# Patient Record
Sex: Male | Born: 1990 | Race: Black or African American | Hispanic: No | Marital: Single | Smoking: Current some day smoker
Health system: Southern US, Community
[De-identification: ages and names within clinical notes are randomized; demographics above are authoritative.]

## PROBLEM LIST (undated history)

## (undated) DIAGNOSIS — J45909 Unspecified asthma, uncomplicated: Secondary | ICD-10-CM

## (undated) HISTORY — PX: TONSILLECTOMY: SUR1361

---
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
## 2010-11-29 ENCOUNTER — Emergency Department: Payer: Self-pay | Admitting: Emergency Medicine

## 2012-03-25 ENCOUNTER — Emergency Department: Payer: Self-pay | Admitting: Emergency Medicine

## 2012-03-25 LAB — COMPREHENSIVE METABOLIC PANEL
Albumin: 3.9 g/dL (ref 3.4–5.0)
Alkaline Phosphatase: 83 U/L (ref 50–136)
Anion Gap: 5 — ABNORMAL LOW (ref 7–16)
BUN: 11 mg/dL (ref 7–18)
Co2: 27 mmol/L (ref 21–32)
Creatinine: 1.13 mg/dL (ref 0.60–1.30)
EGFR (Non-African Amer.): >60
Glucose: 106 mg/dL — ABNORMAL HIGH (ref 65–99)
SGOT(AST): 34 U/L (ref 15–37)
SGPT (ALT): 29 U/L (ref 12–78)
Sodium: 138 mmol/L (ref 136–145)
Total Protein: 8.3 g/dL — ABNORMAL HIGH (ref 6.4–8.2)

## 2012-03-25 LAB — CBC
HCT: 49.1 % (ref 40.0–52.0)
HGB: 16.2 g/dL (ref 13.0–18.0)
MCH: 30.6 pg (ref 26.0–34.0)
Platelet: 262 10*3/uL (ref 150–440)
WBC: 7.8 10*3/uL (ref 3.8–10.6)

## 2012-03-25 LAB — URINALYSIS, COMPLETE
Bacteria: NONE SEEN
Blood: NEGATIVE
Ketone: NEGATIVE
Nitrite: NEGATIVE
Ph: 6 (ref 4.5–8.0)
Protein: NEGATIVE
RBC,UR: 3 /HPF (ref 0–5)
Squamous Epithelial: <1
WBC UR: <1 /HPF (ref 0–5)

## 2015-07-29 ENCOUNTER — Encounter: Payer: Self-pay | Admitting: *Deleted

## 2015-07-29 ENCOUNTER — Ambulatory Visit
Admission: EM | Admit: 2015-07-29 | Discharge: 2015-07-29 | Disposition: A | Discharge status: Home / Self Care | Payer: 59 | Attending: Family Medicine | Admitting: Family Medicine

## 2015-07-29 DIAGNOSIS — H1131 Conjunctival hemorrhage, right eye: Secondary | ICD-10-CM

## 2015-07-29 MED ORDER — KETOTIFEN FUMARATE 0.025 % OP SOLN: 1.0000 [drp] | Freq: Two times a day (BID) | OPHTHALMIC | Status: DC

## 2015-07-29 NOTE — Discharge Instructions (Signed)
Subconjunctival Hemorrhage °Subconjunctival hemorrhage is bleeding that happens between the white part of your eye (sclera) and the clear membrane that covers the outside of your eye (conjunctiva). There are many tiny blood vessels near the surface of your eye. A subconjunctival hemorrhage happens when one or more of these vessels breaks and bleeds, causing a red patch to appear on your eye. This is similar to a bruise. °Depending on the amount of bleeding, the red patch may only cover a small area of your eye or it may cover the entire visible part of the sclera. If a lot of blood collects under the conjunctiva, there may also be swelling. Subconjunctival hemorrhages do not affect your vision or cause pain, but your eye may feel irritated if there is swelling. Subconjunctival hemorrhages usually do not require treatment, and they disappear on their own within two weeks. °CAUSES °This condition may be caused by: °· Mild trauma, such as rubbing your eye too hard. °· Severe trauma or blunt injuries. °· Coughing, sneezing, or vomiting. °· Straining, such as when lifting a heavy object. °· High blood pressure. °· Recent eye surgery. °· A history of diabetes. °· Certain medicines, especially blood thinners (anticoagulants). °· Other conditions, such as eye tumors, bleeding disorders, or blood vessel abnormalities. °Subconjunctival hemorrhages can happen without an obvious cause.  °SYMPTOMS  °Symptoms of this condition include: °· A bright red or dark red patch on the white part of the eye. °¨ The red area may spread out to cover a larger area of the eye before it goes away. °¨ The red area may turn brownish-yellow before it goes away. °· Swelling. °· Mild eye irritation. °DIAGNOSIS °This condition is diagnosed with a physical exam. If your subconjunctival hemorrhage was caused by trauma, your health care provider may refer you to an eye specialist (ophthalmologist) or another specialist to check for other injuries. You  may have other tests, including: °· An eye exam. °· A blood pressure check. °· Blood tests to check for bleeding disorders. °If your subconjunctival hemorrhage was caused by trauma, X-rays or a CT scan may be done to check for other injuries. °TREATMENT °Usually, no treatment is needed. Your health care provider may recommend eye drops or cold compresses to help with discomfort. °HOME CARE INSTRUCTIONS °· Take over-the-counter and prescription medicines only as directed by your health care provider. °· Use eye drops or cold compresses to help with discomfort as directed by your health care provider. °· Avoid activities, things, and environments that may irritate or injure your eye. °· Keep all follow-up visits as told by your health care provider. This is important. °SEEK MEDICAL CARE IF: °· You have pain in your eye. °· The bleeding does not go away within 3 weeks. °· You keep getting new subconjunctival hemorrhages. °SEEK IMMEDIATE MEDICAL CARE IF: °· Your vision changes or you have difficulty seeing. °· You suddenly develop severe sensitivity to light. °· You develop a severe headache, persistent vomiting, confusion, or abnormal tiredness (lethargy). °· Your eye seems to bulge or protrude from your eye socket. °· You develop unexplained bruises on your body. °· You have unexplained bleeding in another area of your body. °  °This information is not intended to replace advice given to you by your health care provider. Make sure you discuss any questions you have with your health care provider. °  °Document Released: 01/08/2005 Document Revised: 09/29/2014 Document Reviewed: 03/17/2014 °Elsevier Interactive Patient Education ©2016 [employer] ° °

## 2015-07-29 NOTE — ED Notes (Signed)
Patient started having symptoms of right eye redness, itchiness, and pain 5 days ago. Patient tried to use OTC eye drops without resolution.

## 2015-07-29 NOTE — ED Provider Notes (Signed)
CSN: [id number]     Arrival date & time 07/29/15  1044 History   First MD Initiated Contact with Patient 07/29/15 1155     Chief Complaint  Patient presents with  . Conjunctivitis   (Consider location/radiation/quality/duration/timing/severity/associated sxs/prior Treatment) HPI   This 25 year old male who began to have symptoms of right eye redness and itchiness and pain over the lateral portion approximate 5 days ago. He does not remember any trauma. He has had no sensation of foreign object. He is only mildly light sensitive. He does not wear contact lenses. He has no history of high blood pressure although his pressure today is 141/94. He has no history of sickle cell trait or disease. He does work in [employer] and does lift heavy objects at times.      History reviewed. No pertinent past medical history. Past Surgical History  Procedure Laterality Date  . Tonsillectomy     Family History  Problem Relation Age of Onset  . Hypertension Mother   . Hypertension Father    Social History  Substance Use Topics  . Smoking status: Never Smoker   . Smokeless tobacco: Never Used  . Alcohol Use: Yes    Review of Systems  Constitutional: Negative for fever, chills, activity change, appetite change and fatigue.  Eyes: Positive for photophobia, pain, discharge, redness and itching. Negative for visual disturbance.  All other systems reviewed and are negative.   Allergies  Review of patient's allergies indicates no known allergies.  Home Medications   Prior to Admission medications   Medication Sig Start Date End Date Taking? Authorizing Provider  ketotifen (ZADITOR) 0.025 % ophthalmic solution Place 1 drop into the right eye 2 (two) times daily. 07/29/15   Lutricia FeilWilliam P Sebasthian Stailey, PA-C   Meds Ordered and Administered this Visit  Medications - No data to display  BP 141/94 mmHg  Pulse 76  Temp(Src) 97.6 F (36.4 C) (Oral)  Resp 16  Ht 5\' 10"  (1.778 m)  Wt 380 lb (172.367 kg)   BMI 54.52 kg/m2  SpO2 98% No data found.   Physical Exam  Constitutional: He is oriented to person, place, and time. He appears well-developed and well-nourished. No distress.  HENT:  Head: Normocephalic and atraumatic.  Eyes: EOM are normal. Pupils are equal, round, and reactive to light. Right eye exhibits no discharge. Left eye exhibits no discharge.  Semination of the right eye shows a large area of subconjunctival bleeding over the lateral aspect. It does not involve the medial portion of the eye. Pupils are equal and reactive to light and accommodation. EOMs are intact. There is no deformity of the pupil and no hyphema visualized. Patient appears to feel comfortable was not squinting. Vision is normal as marked in the record.  Neck: Neck supple.  Musculoskeletal: Normal range of motion.  Lymphadenopathy:    He has no cervical adenopathy.  Neurological: He is alert and oriented to person, place, and time.  Skin: Skin is warm and dry. He is not diaphoretic.  Psychiatric: He has a normal mood and affect. His behavior is normal. Judgment and thought content normal.  Nursing note and vitals reviewed.   ED Course  Procedures (including critical care time)  Labs Review Labs Reviewed - No data to display  Imaging Review No results found.   Visual Acuity Review  Right Eye Distance: 20/20 Left Eye Distance: 20/15 Bilateral Distance:    Right Eye Near:   Left Eye Near:    Bilateral Near:  MDM   1. Subconjunctival hemorrhage of right eye    I reassured the patient that this a conjunctival hemorrhage usually resolves within 2-3 weeks. If he has any worsening of his symptoms have given them the phone number for [facility]. He should probably follow-up with his primary care physician to see if there may be another cause such as sickle cell anemia etc. I also prescribed Zaditor eyedrops for comfort and possible allergic symptoms.    Lutricia FeilWilliam P Abagayle Klutts, PA-C 07/29/15  1233

## 2016-09-28 ENCOUNTER — Ambulatory Visit
Admission: EM | Admit: 2016-09-28 | Discharge: 2016-09-28 | Disposition: A | Discharge status: Home / Self Care | Payer: 59 | Attending: Family Medicine | Admitting: Family Medicine

## 2016-09-28 ENCOUNTER — Encounter: Payer: Self-pay | Admitting: Emergency Medicine

## 2016-09-28 DIAGNOSIS — R0602 Shortness of breath: Secondary | ICD-10-CM | POA: Diagnosis not present

## 2016-09-28 DIAGNOSIS — J45901 Unspecified asthma with (acute) exacerbation: Secondary | ICD-10-CM | POA: Diagnosis not present

## 2016-09-28 DIAGNOSIS — J22 Unspecified acute lower respiratory infection: Secondary | ICD-10-CM | POA: Diagnosis not present

## 2016-09-28 HISTORY — DX: Unspecified asthma, uncomplicated: J45.909

## 2016-09-28 MED ORDER — IPRATROPIUM-ALBUTEROL 0.5-2.5 (3) MG/3ML IN SOLN
3.0000 mL | Freq: Once | RESPIRATORY_TRACT | Status: AC
Start: 2016-09-28 — End: 2016-09-28
  Administered 2016-09-28: 3 mL via RESPIRATORY_TRACT

## 2016-09-28 MED ORDER — BENZONATATE 200 MG PO CAPS: 200.0000 mg | ORAL_CAPSULE | Freq: Three times a day (TID) | ORAL | 0 refills | Status: DC | PRN

## 2016-09-28 MED ORDER — METHYLPREDNISOLONE SODIUM SUCC 125 MG IJ SOLR: 125.0000 mg | Freq: Once | INTRAMUSCULAR | Status: DC

## 2016-09-28 MED ORDER — ALBUTEROL SULFATE HFA 108 (90 BASE) MCG/ACT IN AERS: 2.0000 | INHALATION_SPRAY | RESPIRATORY_TRACT | 0 refills | Status: DC | PRN

## 2016-09-28 MED ORDER — IPRATROPIUM-ALBUTEROL 0.5-2.5 (3) MG/3ML IN SOLN
3.0000 mL | Freq: Once | RESPIRATORY_TRACT | Status: AC
  Administered 2016-09-28: 3 mL via RESPIRATORY_TRACT

## 2016-09-28 MED ORDER — PREDNISONE 10 MG (21) PO TBPK: ORAL_TABLET | ORAL | 0 refills | Status: DC

## 2016-09-28 MED ORDER — METHYLPREDNISOLONE SODIUM SUCC 125 MG IJ SOLR
125.0000 mg | Freq: Once | INTRAMUSCULAR | Status: AC
  Administered 2016-09-28: 125 mg via INTRAMUSCULAR

## 2016-09-28 MED ORDER — AZITHROMYCIN 500 MG PO TABS
ORAL_TABLET | ORAL | 0 refills | Status: DC
Start: 2016-09-28 — End: 2019-06-08

## 2016-09-28 NOTE — ED Notes (Signed)
Patient shows no signs of adverse reaction to medication at this time.  

## 2016-09-28 NOTE — ED Provider Notes (Signed)
[facility]    CSN: [id number] Arrival date & time: 09/28/16  0919     History   Chief Complaint Chief Complaint  Patient presents with  . Shortness of Breath  . Chest Pain    HPI Darryl Gordon is a 26 y.o. male.   Patient is a 26 year old black male who started having shortness of breath yesterday after his third shift ended. States he has some nebulizing solution that was old and old inhaler these been using for the last 24 hours which is helped but it hasn't stopped the wheezing. He also reports tightness in his chest and chest discomfort from his wheezing and cough. Reports cough of yellowish-green sputum. He states that he has a history of asthma as a child but really hasn't had any exacerbations in quite a while. Along with exacerbation he states he's been coughing up this yellowish-green sputum as well. He does smoke cigars occasionally but does not smoke cigarettes. Past medical history he's had tonsillectomy but no other surgeries no other chronic medical problems. No pertinent family medical history relevant to today's visit. No known drug allergies.   The history is provided by the patient. No language interpreter was used.  Shortness of Breath  Severity:  Severe Onset quality:  Sudden Duration:  2 days Timing:  Constant Progression:  Worsening Chronicity:  New Context: smoke exposure and URI   Context: not strong odors   Relieved by:  Nothing Worsened by:  Nothing Ineffective treatments:  None tried Associated symptoms: chest pain   Risk factors: obesity and tobacco use   Risk factors: no family hx of DVT, no hx of cancer and no recent surgery   Chest Pain  Associated symptoms: shortness of breath     Past Medical History:  Diagnosis Date  . Asthma     There are no active problems to display for this patient.   Past Surgical History:  Procedure Laterality Date  . TONSILLECTOMY         Home Medications    Prior to Admission  medications   Medication Sig Start Date End Date Taking? Authorizing Provider  albuterol (PROVENTIL HFA;VENTOLIN HFA) 108 (90 Base) MCG/ACT inhaler Inhale 2 puffs into the lungs every 6 (six) hours as needed for wheezing or shortness of breath.   Yes [provider]  albuterol (PROVENTIL HFA;VENTOLIN HFA) 108 (90 Base) MCG/ACT inhaler Inhale 2 puffs into the lungs every 4 (four) hours as needed. 09/28/16   Hassan Rowan, MD  azithromycin (ZITHROMAX) 500 MG tablet 1 tablet daily for 5 days1 09/28/16   Hassan Rowan, MD  benzonatate (TESSALON) 200 MG capsule Take 1 capsule (200 mg total) by mouth 3 (three) times daily as needed. 09/28/16   Hassan Rowan, MD  predniSONE (STERAPRED UNI-PAK 21 TAB) 10 MG (21) TBPK tablet Sig 6 tablet day 1, 5 tablets day 2, 4 tablets day 3,,3tablets day 4, 2 tablets day 5, 1 tablet day 6 take all tablets orally 09/28/16   Hassan Rowan, MD    Family History Family History  Problem Relation Age of Onset  . Hypertension Mother   . Hypertension Father     Social History Social History  Substance Use Topics  . Smoking status: Current Every Day Smoker    Types: Cigarettes  . Smokeless tobacco: Never Used  . Alcohol use Yes     Allergies   Patient has no known allergies.   Review of Systems Review of Systems  Respiratory: Positive for shortness of  breath.   Cardiovascular: Positive for chest pain.  All other systems reviewed and are negative.    Physical Exam Triage Vital Signs ED Triage Vitals  Enc Vitals Group     BP 09/28/16 0949 (S) (!) 163/67     Pulse Rate 09/28/16 0949 100     Resp 09/28/16 0949 19     Temp 09/28/16 0949 98.6 F (37 C)     Temp Source 09/28/16 0949 Oral     SpO2 09/28/16 0949 92 %     Weight 09/28/16 0947 (!) 395 lb (179.2 kg)     Height 09/28/16 0947 5\' 10"  (1.778 m)     Head Circumference --      Peak Flow --      Pain Score 09/28/16 0947 7     Pain Loc --      Pain Edu? --      Excl. in [place]? --    No data  found.   Updated Vital Signs BP (S) (!) 163/67 (BP Location: Left Arm)   Pulse 100   Temp 98.6 F (37 C) (Oral)   Resp 19   Ht 5\' 10"  (1.778 m)   Wt (!) 395 lb (179.2 kg)   SpO2 92%   BMI 56.68 kg/m   Visual Acuity Right Eye Distance:   Left Eye Distance:   Bilateral Distance:    Right Eye Near:   Left Eye Near:    Bilateral Near:     Physical Exam  Constitutional: He is oriented to person, place, and time. He appears well-developed and well-nourished.  HENT:  Head: Normocephalic.  Eyes: Pupils are equal, round, and reactive to light. Conjunctivae and EOM are normal.  Neck: Normal range of motion. Neck supple.  Cardiovascular: Normal rate and regular rhythm.   Pulmonary/Chest: Effort normal and breath sounds normal.  Musculoskeletal: Normal range of motion. He exhibits no edema or deformity.  Neurological: He is alert and oriented to person, place, and time.  Skin: Skin is warm. He is not diaphoretic.  Psychiatric: He has a normal mood and affect.  Vitals reviewed.    UC Treatments / Results  Labs (all labs ordered are listed, but only abnormal results are displayed) Labs Reviewed - No data to display  EKG  EKG Interpretation None       Radiology No results found.  Procedures Procedures (including critical care time)  Medications Ordered in UC Medications  ipratropium-albuterol (DUONEB) 0.5-2.5 (3) MG/3ML nebulizer solution 3 mL (3 mLs Nebulization Given 09/28/16 0950)  ipratropium-albuterol (DUONEB) 0.5-2.5 (3) MG/3ML nebulizer solution 3 mL (3 mLs Nebulization Given 09/28/16 1015)  methylPREDNISolone sodium succinate (SOLU-MEDROL) 125 mg/2 mL injection 125 mg (125 mg Intramuscular Given 09/28/16 1014)     Initial Impression / Assessment and Plan / UC Course  I have reviewed the triage vital signs and the nursing notes.  Pertinent labs & imaging results that were available during my care of the patient were reviewed by me and considered in my medical  decision making (see chart for details).     We'll place patient on albuterol inhaler Solu-Medrol 125 IM and placement on 6 day course of prednisone by mouth also will place him on Tessalon Perles and Zithromax 500 mg daily for 5 days. Follow-up with PCP in 2-3 weeks as needed but to go to the ED of his choice if he gets worse the next 24 hours he was given 2 aerosol treatments here and 125 mg sorry Medrol and I am  explained to him that there is not much more I can give him at the urgent care. Work note given for today and tomorrow as well.  Final Clinical Impressions(s) / UC Diagnoses   Final diagnoses:  Shortness of breath  Severe asthma with exacerbation, unspecified whether persistent  Lower resp. tract infection    New Prescriptions New Prescriptions   ALBUTEROL (PROVENTIL HFA;VENTOLIN HFA) 108 (90 BASE) MCG/ACT INHALER    Inhale 2 puffs into the lungs every 4 (four) hours as needed.   AZITHROMYCIN (ZITHROMAX) 500 MG TABLET    1 tablet daily for 5 days1   BENZONATATE (TESSALON) 200 MG CAPSULE    Take 1 capsule (200 mg total) by mouth 3 (three) times daily as needed.   PREDNISONE (STERAPRED UNI-PAK 21 TAB) 10 MG (21) TBPK TABLET    Sig 6 tablet day 1, 5 tablets day 2, 4 tablets day 3,,3tablets day 4, 2 tablets day 5, 1 tablet day 6 take all tablets orally   Note: This dictation was prepared with Dragon dictation along with smaller phrase technology. Any transcriptional errors that result from this process are unintentional.  Controlled Substance Prescriptions [place] Controlled Substance Registry consulted? Not Applicable   Hassan Rowan, MD 09/28/16 1027

## 2016-09-28 NOTE — ED Triage Notes (Signed)
Patient c/o chest pain and SOB that started yesterday. Patient reports cough and chest congestion that started yesterday.

## 2019-06-08 ENCOUNTER — Other Ambulatory Visit: Payer: Self-pay

## 2019-06-08 ENCOUNTER — Ambulatory Visit
Admission: EM | Admit: 2019-06-08 | Discharge: 2019-06-08 | Disposition: A | Discharge status: Home / Self Care | Payer: 59 | Attending: Family Medicine | Admitting: Family Medicine

## 2019-06-08 DIAGNOSIS — M545 Low back pain, unspecified: Secondary | ICD-10-CM

## 2019-06-08 HISTORY — DX: Morbid (severe) obesity due to excess calories: E66.01

## 2019-06-08 MED ORDER — MELOXICAM 15 MG PO TABS: 15.0000 mg | ORAL_TABLET | Freq: Every day | ORAL | 0 refills | Status: DC | PRN

## 2019-06-08 MED ORDER — TIZANIDINE HCL 4 MG PO TABS: 4.0000 mg | ORAL_TABLET | Freq: Three times a day (TID) | ORAL | 0 refills | Status: DC | PRN

## 2019-06-08 NOTE — ED Triage Notes (Signed)
Pt reports he was spreading mulch over the weekend and then started yesterday with low back pain. Denies radiation

## 2019-06-08 NOTE — ED Provider Notes (Signed)
[facility]    CSN: [id number] Arrival date & time: 06/08/19  1805  History   Chief Complaint Chief Complaint  Patient presents with  . Back Pain   HPI  29 year old male presents with low back pain.  Started abruptly yesterday.  He states that he was spreading mulch over the weekend.  No fall, trauma, injury.  Pain is located bilaterally.  Seems to be worse with activity.  Pain 7/10 in severity.  No radicular symptoms.  No saddle anesthesia or incontinence.  No other associated symptoms.  No other complaints.  Past Medical History:  Diagnosis Date  . Asthma   . Morbid obesity ([place])    Past Surgical History:  Procedure Laterality Date  . TONSILLECTOMY     Home Medications    Prior to Admission medications   Medication Sig Start Date End Date Taking? Authorizing Provider  meloxicam (MOBIC) 15 MG tablet Take 1 tablet (15 mg total) by mouth daily as needed for pain. 06/08/19   Coral Spikes, DO  tiZANidine (ZANAFLEX) 4 MG tablet Take 1 tablet (4 mg total) by mouth every 8 (eight) hours as needed for muscle spasms. 06/08/19   Coral Spikes, DO  albuterol (PROVENTIL HFA;VENTOLIN HFA) 108 (90 Base) MCG/ACT inhaler Inhale 2 puffs into the lungs every 4 (four) hours as needed. 09/28/16 06/08/19  Frederich Cha, MD    Family History Family History  Problem Relation Age of Onset  . Hypertension Mother   . Hypertension Father     Social History Social History   Tobacco Use  . Smoking status: Current Some Day Smoker    Types: Cigars  . Smokeless tobacco: Never Used  Substance Use Topics  . Alcohol use: Yes    Comment: social  . Drug use: No     Allergies   Patient has no known allergies.   Review of Systems Review of Systems  Constitutional: Negative.   Musculoskeletal: Positive for back pain.   Physical Exam Triage Vital Signs ED Triage Vitals  Enc Vitals Group     BP 06/08/19 1814 (!) 185/91     Pulse Rate 06/08/19 1814 86     Resp 06/08/19 1814 20     Temp 06/08/19 1814 98.4 F (36.9 C)     Temp Source 06/08/19 1814 Oral     SpO2 06/08/19 1814 99 %     Weight --      Height 06/08/19 1812 5\' 10"  (1.778 m)     Head Circumference --      Peak Flow --      Pain Score 06/08/19 1811 7     Pain Loc --      Pain Edu? --      Excl. in [place]? --    Updated Vital Signs BP (!) 185/91 (BP Location: Left Arm)   Pulse 86   Temp 98.4 F (36.9 C) (Oral)   Resp 20   Ht 5\' 10"  (1.778 m)   SpO2 99%   BMI 56.68 kg/m   Visual Acuity Right Eye Distance:   Left Eye Distance:   Bilateral Distance:    Right Eye Near:   Left Eye Near:    Bilateral Near:     Physical Exam Vitals and nursing note reviewed.  Constitutional:      General: He is not in acute distress.    Appearance: Normal appearance. He is not ill-appearing.  HENT:     Head: Normocephalic and atraumatic.  Eyes:  General:        Right eye: No discharge.        Left eye: No discharge.     Conjunctiva/sclera: Conjunctivae normal.  Cardiovascular:     Rate and Rhythm: Normal rate and regular rhythm.  Pulmonary:     Effort: Pulmonary effort is normal.     Breath sounds: Normal breath sounds. No wheezing, rhonchi or rales.  Musculoskeletal:     Comments: Lumbar spine - paraspinal muscular tenderness bilaterally.   Neurological:     Mental Status: He is alert.  Psychiatric:        Mood and Affect: Mood normal.        Behavior: Behavior normal.    UC Treatments / Results  Labs (all labs ordered are listed, but only abnormal results are displayed) Labs Reviewed - No data to display  EKG   Radiology No results found.  Procedures Procedures (including critical care time)  Medications Ordered in UC Medications - No data to display  Initial Impression / Assessment and Plan / UC Course  I have reviewed the triage vital signs and the nursing notes.  Pertinent labs & imaging results that were available during my care of the patient were reviewed by me and  considered in my medical decision making (see chart for details).    29 year old male presents with low back pain. Treating with Mobic and Zanaflex.    Final Clinical Impressions(s) / UC Diagnoses   Final diagnoses:  Acute bilateral low back pain without sciatica   Discharge Instructions   None    ED Prescriptions    Medication Sig Dispense Auth. Provider   meloxicam (MOBIC) 15 MG tablet Take 1 tablet (15 mg total) by mouth daily as needed for pain. 30 tablet Mersadies Petree G, DO   tiZANidine (ZANAFLEX) 4 MG tablet Take 1 tablet (4 mg total) by mouth every 8 (eight) hours as needed for muscle spasms. 30 tablet Tommie Sams, DO     PDMP not reviewed this encounter.   Tommie Sams, [place] 06/08/19 1958

## 2020-11-02 ENCOUNTER — Ambulatory Visit
Admission: EM | Admit: 2020-11-02 | Discharge: 2020-11-02 | Disposition: A | Discharge status: Home / Self Care | Payer: Commercial Managed Care - PPO

## 2020-11-02 ENCOUNTER — Other Ambulatory Visit: Payer: Self-pay

## 2020-11-02 DIAGNOSIS — J069 Acute upper respiratory infection, unspecified: Secondary | ICD-10-CM | POA: Diagnosis not present

## 2020-11-02 DIAGNOSIS — J4521 Mild intermittent asthma with (acute) exacerbation: Secondary | ICD-10-CM | POA: Diagnosis not present

## 2020-11-02 MED ORDER — AEROCHAMBER MV MISC: 2 refills | Status: AC

## 2020-11-02 MED ORDER — AMOXICILLIN-POT CLAVULANATE 875-125 MG PO TABS: 1.0000 | ORAL_TABLET | Freq: Two times a day (BID) | ORAL | 0 refills | Status: AC

## 2020-11-02 MED ORDER — PREDNISONE 20 MG PO TABS: 60.0000 mg | ORAL_TABLET | Freq: Every day | ORAL | 0 refills | Status: AC

## 2020-11-02 MED ORDER — ALBUTEROL SULFATE HFA 108 (90 BASE) MCG/ACT IN AERS: 2.0000 | INHALATION_SPRAY | RESPIRATORY_TRACT | 0 refills | Status: DC | PRN

## 2020-11-02 MED ORDER — PROMETHAZINE-DM 6.25-15 MG/5ML PO SYRP: 5.0000 mL | ORAL_SOLUTION | Freq: Four times a day (QID) | ORAL | 0 refills | Status: DC | PRN

## 2020-11-02 MED ORDER — IPRATROPIUM BROMIDE 0.06 % NA SOLN: 2.0000 | Freq: Four times a day (QID) | NASAL | 12 refills | Status: DC

## 2020-11-02 MED ORDER — BENZONATATE 100 MG PO CAPS: 200.0000 mg | ORAL_CAPSULE | Freq: Three times a day (TID) | ORAL | 0 refills | Status: DC

## 2020-11-02 NOTE — ED Triage Notes (Signed)
Pt c/o congestion, headache, cough, shob/tightness since Monday. Pt denies f/n/v/d or other symptoms.

## 2020-11-02 NOTE — Discharge Instructions (Signed)
Take the Augmentin twice daily for 10 days for treatment of your URI.  Use the Albuterol inhaler with the spacer, 2 puffs every 4-6 hours as needed for shortness of breath and wheezing.  Take the Prednisone daily, 3 tablets with breakfast, for 5 days.   Use the Atrovent nasal spray, 2 squirts in each nostril every 6 hours, as needed for runny nose and postnasal drip.  Use the Tessalon Perles every 8 hours during the day.  Take them with a small sip of water.  They may give you some numbness to the base of your tongue or a metallic taste in your mouth, this is normal.  Use the Promethazine DM cough syrup at bedtime for cough and congestion.  It will make you drowsy so do not take it during the day.  Return for reevaluation or see your primary care provider for any new or worsening symptoms.

## 2020-11-02 NOTE — ED Provider Notes (Signed)
[facility]    CSN: [id number] Arrival date & time: 11/02/20  0954      History   Chief Complaint Chief Complaint  Patient presents with   Cough   Nasal Congestion    HPI Darryl Gordon is a 30 y.o. male.   HPI  30 year old male here for evaluation of respiratory complaints.  Patient reports that for the last 2 days he has been experiencing headache, nasal congestion with yellow nasal discharge, cough that is intermittently productive for clear sputum, shortness of breath, and wheezing.  He has been unable to work for the past 2 days because every time he tries to move around he says he gets a pain in his chest.  He states the pain is more that it hurts when he tries to breathe and is not constant.  It does resolve with rest.  He has not had a fever, ear pain, sore throat, nausea, diarrhea, sweating, and no known sick contacts.  Past Medical History:  Diagnosis Date   Asthma    Morbid obesity (HCC)     There are no problems to display for this patient.   Past Surgical History:  Procedure Laterality Date   TONSILLECTOMY         Home Medications    Prior to Admission medications   Medication Sig Start Date End Date Taking? Authorizing Provider  albuterol (VENTOLIN HFA) 108 (90 Base) MCG/ACT inhaler Inhale 2 puffs into the lungs every 4 (four) hours as needed. 11/02/20  Yes Becky Augusta, NP  amoxicillin-clavulanate (AUGMENTIN) 875-125 MG tablet Take 1 tablet by mouth every 12 (twelve) hours for 10 days. 11/02/20 11/12/20 Yes Becky Augusta, NP  benzonatate (TESSALON) 100 MG capsule Take 2 capsules (200 mg total) by mouth every 8 (eight) hours. 11/02/20  Yes Becky Augusta, NP  ipratropium (ATROVENT) 0.06 % nasal spray Place 2 sprays into both nostrils 4 (four) times daily. 11/02/20  Yes Becky Augusta, NP  predniSONE (DELTASONE) 20 MG tablet Take 3 tablets (60 mg total) by mouth daily with breakfast for 5 days. 3 tablets daily for 5 days. 11/02/20  11/07/20 Yes Becky Augusta, NP  promethazine-dextromethorphan (PROMETHAZINE-DM) 6.25-15 MG/5ML syrup Take 5 mLs by mouth 4 (four) times daily as needed. 11/02/20  Yes Becky Augusta, NP  Spacer/Aero-Holding Deretha Emory (AEROCHAMBER MV) inhaler Use as instructed 11/02/20  Yes Becky Augusta, NP    Family History Family History  Problem Relation Age of Onset   Hypertension Mother    Hypertension Father     Social History Social History   Tobacco Use   Smoking status: Some Days    Types: Cigars   Smokeless tobacco: Never  Substance Use Topics   Alcohol use: Yes    Comment: social   Drug use: No     Allergies   Patient has no known allergies.   Review of Systems Review of Systems  Constitutional:  Negative for diaphoresis and fever.  HENT:  Positive for congestion and rhinorrhea. Negative for ear pain and sore throat.   Respiratory:  Positive for cough, shortness of breath and wheezing.   Gastrointestinal:  Negative for diarrhea, nausea and vomiting.  Skin:  Negative for rash.  Neurological:  Positive for headaches.  Hematological: Negative.   Psychiatric/Behavioral: Negative.      Physical Exam Triage Vital Signs ED Triage Vitals  Enc Vitals Group     BP 11/02/20 1023 (!) 157/109     Pulse Rate 11/02/20 1023 88  Resp 11/02/20 1023 18     Temp 11/02/20 1023 98.4 F (36.9 C)     Temp Source 11/02/20 1023 Oral     SpO2 11/02/20 1023 98 %     Weight 11/02/20 1021 (!) 320 lb (145.2 kg)     Height 11/02/20 1021 5\' 10"  (1.778 m)     Head Circumference --      Peak Flow --      Pain Score 11/02/20 1020 8     Pain Loc --      Pain Edu? --      Excl. in [place]? --    No data found.  Updated Vital Signs BP (!) 157/109 (BP Location: Left Wrist)   Pulse 88   Temp 98.4 F (36.9 C) (Oral)   Resp 18   Ht 5\' 10"  (1.778 m)   Wt (!) 320 lb (145.2 kg)   SpO2 98%   BMI 45.92 kg/m   Visual Acuity Right Eye Distance:   Left Eye Distance:   Bilateral Distance:    Right  Eye Near:   Left Eye Near:    Bilateral Near:     Physical Exam Vitals and nursing note reviewed.  Constitutional:      General: He is not in acute distress.    Appearance: Normal appearance. He is obese. He is ill-appearing.  HENT:     Head: Normocephalic and atraumatic.     Right Ear: Tympanic membrane, ear canal and external ear normal. There is no impacted cerumen.     Left Ear: Tympanic membrane, ear canal and external ear normal. There is no impacted cerumen.     Nose: Congestion and rhinorrhea present.     Mouth/Throat:     Mouth: Mucous membranes are moist.     Pharynx: Oropharynx is clear. Posterior oropharyngeal erythema present.  Cardiovascular:     Rate and Rhythm: Normal rate and regular rhythm.     Pulses: Normal pulses.     Heart sounds: Normal heart sounds. No murmur heard.   No gallop.  Pulmonary:     Effort: Pulmonary effort is normal.     Breath sounds: Wheezing present. No rhonchi or rales.  Musculoskeletal:     Cervical back: Normal range of motion and neck supple.  Lymphadenopathy:     Cervical: No cervical adenopathy.  Skin:    General: Skin is warm and dry.     Capillary Refill: Capillary refill takes less than 2 seconds.     Findings: No erythema or rash.  Neurological:     General: No focal deficit present.     Mental Status: He is alert and oriented to person, place, and time.  Psychiatric:        Mood and Affect: Mood normal.        Behavior: Behavior normal.        Thought Content: Thought content normal.        Judgment: Judgment normal.     UC Treatments / Results  Labs (all labs ordered are listed, but only abnormal results are displayed) Labs Reviewed - No data to display  EKG   Radiology No results found.  Procedures Procedures (including critical care time)  Medications Ordered in UC Medications - No data to display  Initial Impression / Assessment and Plan / UC Course  I have reviewed the triage vital signs and the  nursing notes.  Pertinent labs & imaging results that were available during my care of the patient were reviewed  by me and considered in my medical decision making (see chart for details).  Patient is a pleasant though ill-appearing 30 year old male here for evaluation of respiratory complaints.  Patient is morbidly obese with a BMI of 45.92 and a weight of 320 pounds.  Patient's symptoms did not consist of headache, nasal congestion with yellow nasal discharge, intermittently productive cough or clear sputum with shortness breath or wheezing, and chest tightness and pain with respirations and activity.  The pain resolves at rest.  There is no radiation of pain this not associated with nausea or sweating.  Patient does have a history of asthma and he has an outdated inhaler which she recently has started to use and reports that it is helping.  He has not been able to work for the past 2 days because he has felt so bad.  Patient physical exam reveals protegrin tympanic membranes bilaterally with normal light reflex and clear external auditory canals.  Nasal mucosa is erythematous edematous with scant clear nasal discharge in both nares.  Oropharyngeal exam reveals mild posterior oropharyngeal erythema with clear postnasal drip.  No cervical lymphadenopathy appreciable exam.  Cardiopulmonary exam reveals wheezes in all lung fields with the creased lung sounds.  Patient has normal chest excursion and he can speak in full sentences.  There is no dyspnea present on exam.  Patient exam is consistent with an upper respiratory infection and asthma exacerbation.  Due to the patient's morbid obesity and history of asthma will treat with Augmentin to cover any bacterial sources despite the short duration of symptoms.  I have refilled the patient's albuterol inhaler and also prescribed a spacer which I instructed him on how to use.  We will give 5-day burst dose of prednisone 60 mg daily to help with asthma symptoms as well  as Atrovent nasal spray, Tessalon Perles, and Promethazine DM cough syrup for cough and congestion.  Work note provided.   Final Clinical Impressions(s) / UC Diagnoses   Final diagnoses:  Upper respiratory tract infection, unspecified type  Mild intermittent asthma with acute exacerbation     Discharge Instructions      Take the Augmentin twice daily for 10 days for treatment of your URI.  Use the Albuterol inhaler with the spacer, 2 puffs every 4-6 hours as needed for shortness of breath and wheezing.  Take the Prednisone daily, 3 tablets with breakfast, for 5 days.   Use the Atrovent nasal spray, 2 squirts in each nostril every 6 hours, as needed for runny nose and postnasal drip.  Use the Tessalon Perles every 8 hours during the day.  Take them with a small sip of water.  They may give you some numbness to the base of your tongue or a metallic taste in your mouth, this is normal.  Use the Promethazine DM cough syrup at bedtime for cough and congestion.  It will make you drowsy so do not take it during the day.  Return for reevaluation or see your primary care provider for any new or worsening symptoms.      ED Prescriptions     Medication Sig Dispense Auth. Provider   albuterol (VENTOLIN HFA) 108 (90 Base) MCG/ACT inhaler Inhale 2 puffs into the lungs every 4 (four) hours as needed. 18 g Becky Augusta, NP   Spacer/Aero-Holding Chambers (AEROCHAMBER MV) inhaler Use as instructed 1 each Becky Augusta, NP   benzonatate (TESSALON) 100 MG capsule Take 2 capsules (200 mg total) by mouth every 8 (eight) hours. 21 capsule Alycia Rossetti,  Riki Rusk, NP   ipratropium (ATROVENT) 0.06 % nasal spray Place 2 sprays into both nostrils 4 (four) times daily. 15 mL Becky Augusta, NP   amoxicillin-clavulanate (AUGMENTIN) 875-125 MG tablet Take 1 tablet by mouth every 12 (twelve) hours for 10 days. 20 tablet Becky Augusta, NP   promethazine-dextromethorphan (PROMETHAZINE-DM) 6.25-15 MG/5ML syrup Take 5 mLs by  mouth 4 (four) times daily as needed. 118 mL Becky Augusta, NP   predniSONE (DELTASONE) 20 MG tablet Take 3 tablets (60 mg total) by mouth daily with breakfast for 5 days. 3 tablets daily for 5 days. 15 tablet Becky Augusta, NP      PDMP not reviewed this encounter.   Becky Augusta, NP 11/02/20 1057

## 2021-04-10 ENCOUNTER — Ambulatory Visit
Admission: EM | Admit: 2021-04-10 | Discharge: 2021-04-10 | Disposition: A | Discharge status: Home / Self Care | Payer: 59 | Attending: Emergency Medicine | Admitting: Emergency Medicine

## 2021-04-10 ENCOUNTER — Other Ambulatory Visit: Payer: Self-pay

## 2021-04-10 DIAGNOSIS — H5712 Ocular pain, left eye: Secondary | ICD-10-CM

## 2021-04-10 DIAGNOSIS — H1132 Conjunctival hemorrhage, left eye: Secondary | ICD-10-CM | POA: Diagnosis not present

## 2021-04-10 NOTE — ED Provider Notes (Signed)
HPI ? ?SUBJECTIVE: ? ?[facility] is a 31 y.o. male who presents with 4 days of left eye redness, pain, foreign body sensation along the medial canthus, sharp shooting pain when he looks to the right, blurry vision, "a little bit" of photophobia after getting some dust in his eyes.  He reports periorbital swelling yesterday.  No halos around lights, pain worse in the dark, fevers, body aches, headaches, double vision, visual loss, purulent discharge.  He reports increased watery tearing.  He does not wear glasses or contacts.  No contacts with pinkeye.  No allergy symptoms.  He has tried an unknown pinkeye drops with improvement in his symptoms.  Symptoms are worse with looking to the right.  He has never had symptoms like this before.  He has a past medical history of asthma.  No history of diabetes, hypertension, sickle cell disease, glaucoma.  PCP: None. ? ? ?Past Medical History:  ?Diagnosis Date  ? Asthma   ? Morbid obesity ([place])   ? ? ?Past Surgical History:  ?Procedure Laterality Date  ? TONSILLECTOMY    ? ? ?Family History  ?Problem Relation Age of Onset  ? Hypertension Mother   ? Hypertension Father   ? ? ?Social History  ? ?Tobacco Use  ? Smoking status: Some Days  ?  Types: Cigars  ? Smokeless tobacco: Never  ?Substance Use Topics  ? Alcohol use: Yes  ?  Comment: social  ? Drug use: No  ? ? ?No current facility-administered medications for this encounter. ? ?Current Outpatient Medications:  ?  albuterol (VENTOLIN HFA) 108 (90 Base) MCG/ACT inhaler, Inhale 2 puffs into the lungs every 4 (four) hours as needed., Disp: 18 g, Rfl: 0 ?  ipratropium (ATROVENT) 0.06 % nasal spray, Place 2 sprays into both nostrils 4 (four) times daily., Disp: 15 mL, Rfl: 12 ?  Spacer/Aero-Holding Chambers (AEROCHAMBER MV) inhaler, Use as instructed, Disp: 1 each, Rfl: 2 ? ?No Known Allergies ? ? ?ROS ? ?As noted in HPI.  ? ?Physical Exam ? ?BP (!) 148/107 (BP Location: Left Wrist)   Pulse 93   Temp 98.2 ?F (36.8 ?C)  (Oral)   Resp 20   SpO2 97%  ? ?Constitutional: Well developed, well nourished, no acute distress ?Eyes:  EOMI, PERRLA positive subconjunctival hemorrhage entire left eye, positive chemosis.  Positive direct photophobia, no consensual photophobia.  Pain with looking towards medial canthus.  Tenderness along the medial upper and lower eyelid.  No other periorbital tenderness.  No hyphema.  No foreign body seen on lid eversion.  No abrasion, corneal foreign body.  Positive increased tearing. ? ? ? ? ? ? ? ? ? ? ? ? ? ? ? ?  Visual Acuity ? ?Right Eye Distance: 20/20 ?Left Eye Distance: 20/40 ?Bilateral Distance: 20/25 ? ?Right Eye Near:   ?Left Eye Near:    ?Bilateral Near:     ? ?HENT: Normocephalic, atraumatic,mucus membranes moist ?Respiratory: Normal inspiratory effort ?Cardiovascular: Normal rate ?GI: nondistended ?skin: No rash, skin intact ?Musculoskeletal: no deformities ?Neurologic: Alert & oriented x 3, no focal neuro deficits ?Psychiatric: Speech and behavior appropriate ? ? ?ED Course ? ? ?Medications - No data to display ? ?Orders Placed This Encounter  ?Procedures  ? Visual acuity screening  ?  Standing Status:   Standing  ?  Number of Occurrences:   1  ? ? ?No results found for this or any previous visit (from the past 24 hour(s)). ?No results found. ? ?ED Clinical Impression ? ?  1. Subconjunctival hemorrhage of left eye   ?2. Eye pain, left   ?  ? ?ED Assessment/Plan ? ?Patient with an intensely red eye consistent with a subconjunctival hemorrhage, some chemosis, visual changes and tenderness along the medial eye.  Concern for retained foreign body versus postseptal cellulitis.  Discussed case with Dr. Wallace Going, ophthalmology on-call.  He will see the patient in his office at 1300 today. ? ?Discussed  MDM, treatment plan, and plan for follow-up with patient.  Emphasized importance of ophthalmology follow-up today patient agrees with plan.  ? ?No orders of the defined types were placed in this  encounter. ? ? ? ? ?*This clinic note was created using Lobbyist. Therefore, there may be occasional mistakes despite careful proofreading. ? ?? ? ?  ?Melynda Ripple, MD ?04/10/21 1133 ? ?

## 2021-04-10 NOTE — Discharge Instructions (Addendum)
Tell them that you were seen at [facility] urgent care, and that the physician talked to Dr. Inez Pilgrim.  He will see you at his office at 1 PM today.  It is important that you are seen by an ophthalmologist today.  This could be a eyesight threatening issue. ?

## 2021-04-10 NOTE — ED Triage Notes (Signed)
Pt presents with L eye redness and drainage worsening x 4 days.  Started as pinkness in eye, now entire ete red.  Reports itching, crusting in morning, feeling like there is something in it.  No known injury. Has been using saline drops.  ?

## 2021-04-13 ENCOUNTER — Encounter: Payer: Self-pay | Admitting: Emergency Medicine

## 2021-04-13 ENCOUNTER — Telehealth: Payer: 59 | Admitting: Family Medicine

## 2021-04-13 ENCOUNTER — Other Ambulatory Visit: Payer: Self-pay

## 2021-04-13 ENCOUNTER — Emergency Department
Admission: EM | Admit: 2021-04-13 | Discharge: 2021-04-13 | Disposition: A | Discharge status: Home / Self Care | Payer: 59 | Attending: Emergency Medicine | Admitting: Emergency Medicine

## 2021-04-13 ENCOUNTER — Emergency Department: Payer: 59

## 2021-04-13 DIAGNOSIS — H109 Unspecified conjunctivitis: Secondary | ICD-10-CM | POA: Diagnosis not present

## 2021-04-13 DIAGNOSIS — H5789 Other specified disorders of eye and adnexa: Secondary | ICD-10-CM | POA: Diagnosis present

## 2021-04-13 DIAGNOSIS — H5712 Ocular pain, left eye: Secondary | ICD-10-CM

## 2021-04-13 LAB — CBC WITH DIFFERENTIAL/PLATELET
Abs Immature Granulocytes: 0.02 10*3/uL (ref 0.00–0.07)
Basophils Absolute: 0 10*3/uL (ref 0.0–0.1)
Basophils Relative: 0 %
Eosinophils Absolute: 0.1 10*3/uL (ref 0.0–0.5)
Eosinophils Relative: 2 %
HCT: 47.4 % (ref 39.0–52.0)
Hemoglobin: 15.1 g/dL (ref 13.0–17.0)
Immature Granulocytes: 0 %
Lymphocytes Relative: 37 %
Lymphs Abs: 2.6 10*3/uL (ref 0.7–4.0)
MCH: 28.4 pg (ref 26.0–34.0)
MCHC: 31.9 g/dL (ref 30.0–36.0)
MCV: 89.1 fL (ref 80.0–100.0)
Monocytes Absolute: 1.3 10*3/uL — ABNORMAL HIGH (ref 0.1–1.0)
Monocytes Relative: 18 %
Neutro Abs: 3.1 10*3/uL (ref 1.7–7.7)
Neutrophils Relative %: 43 %
Platelets: 267 10*3/uL (ref 150–400)
RBC: 5.32 MIL/uL (ref 4.22–5.81)
RDW: 13.7 % (ref 11.5–15.5)
WBC: 7.1 10*3/uL (ref 4.0–10.5)
nRBC: 0 % (ref 0.0–0.2)

## 2021-04-13 LAB — BASIC METABOLIC PANEL
Anion gap: 9 (ref 5–15)
BUN: 9 mg/dL (ref 6–20)
CO2: 28 mmol/L (ref 22–32)
Calcium: 9.1 mg/dL (ref 8.9–10.3)
Chloride: 101 mmol/L (ref 98–111)
Creatinine, Ser: 1.03 mg/dL (ref 0.61–1.24)
GFR, Estimated: >60 mL/min (ref >60)
Glucose, Bld: 116 mg/dL — ABNORMAL HIGH (ref 70–99)
Potassium: 4.1 mmol/L (ref 3.5–5.1)
Sodium: 138 mmol/L (ref 135–145)

## 2021-04-13 IMAGING — CT CT ORBITS W/ CM
3 of 6 series · 14 of 47 positions shown, 17 images · IV contrast (agent unspecified)
Comparison: No pertinent prior exam.

CLINICAL DATA: left eye pain

EXAM:
CT ORBITS WITH CONTRAST
TECHNIQUE: Multidetector CT images was performed according to the standard
protocol following intravenous contrast administration.

[Series 2: orbits 2.0 hr60 bone · axial · 0.40mm/px · z∈[-151,-53]mm · 9 of 59 slices shown, 12 images]
[im 5/59  brain]
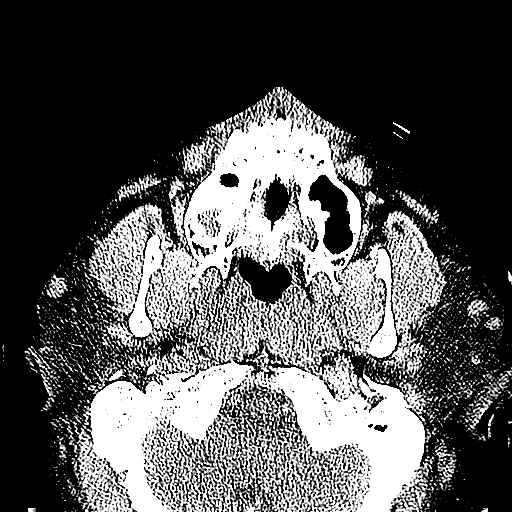
[im 5/59  bone]
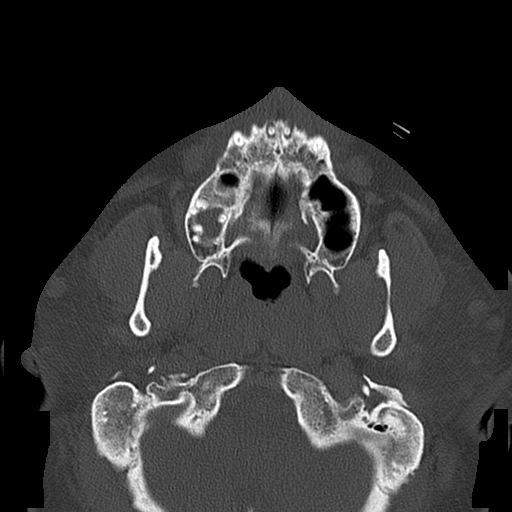
[im 13/59  bone]
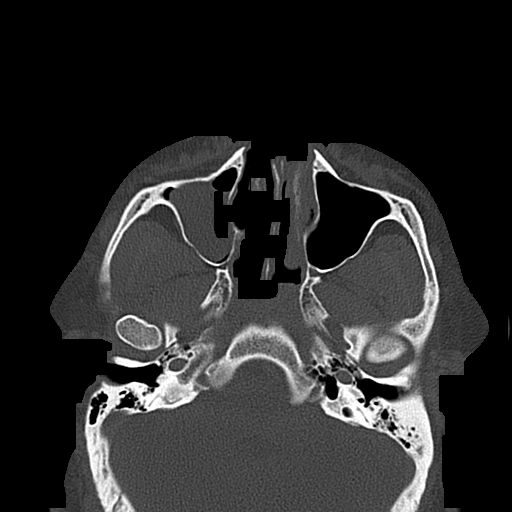
[im 17/59  bone]
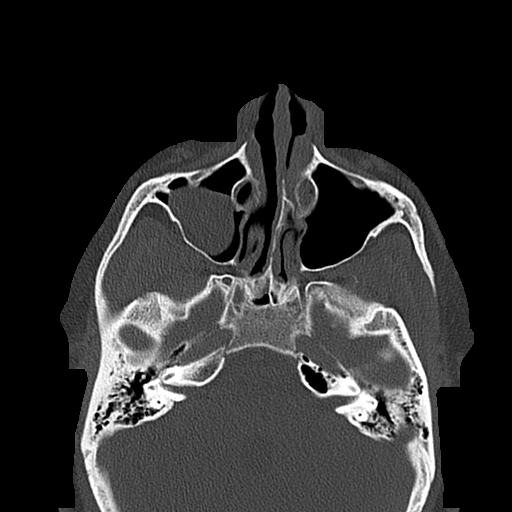
[im 25/59  bone]
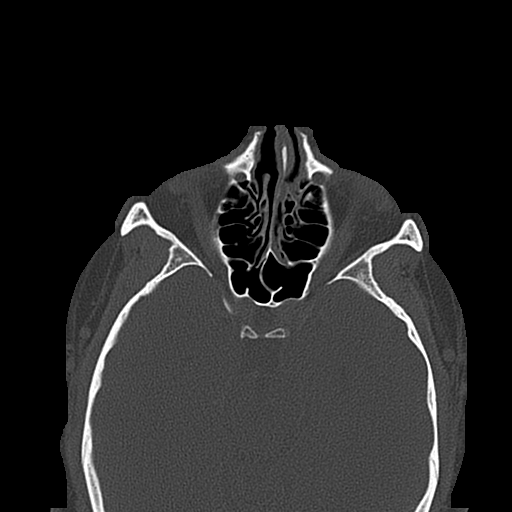
[im 30/59  brain]
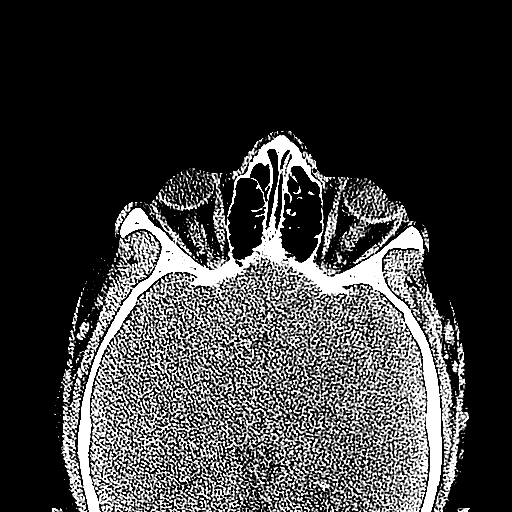
[im 30/59  bone]
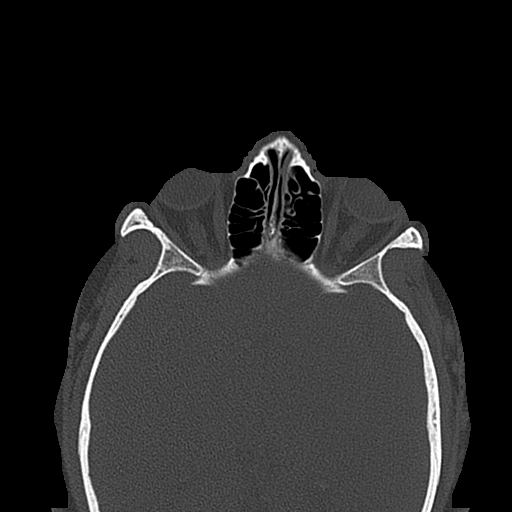
[im 34/59  bone]
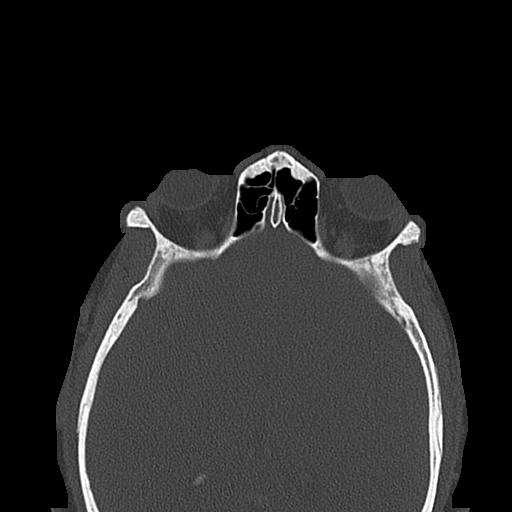
[im 42/59  bone]
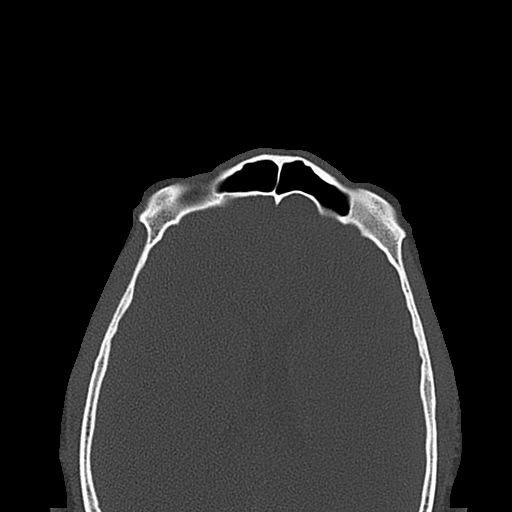
[im 46/59  bone]
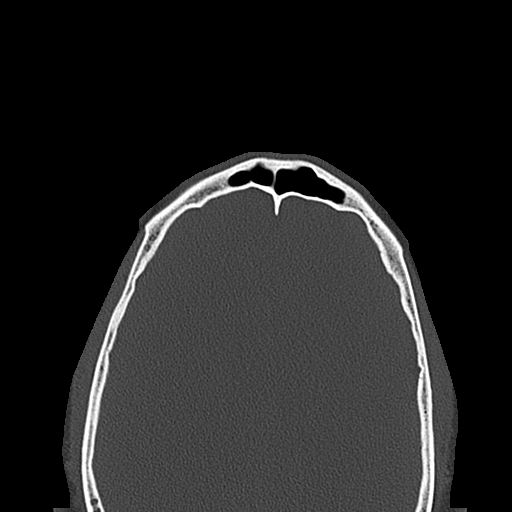
[im 54/59  brain]
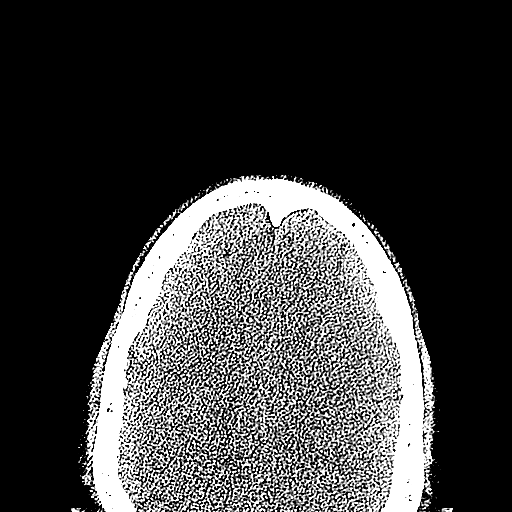
[im 54/59  bone]
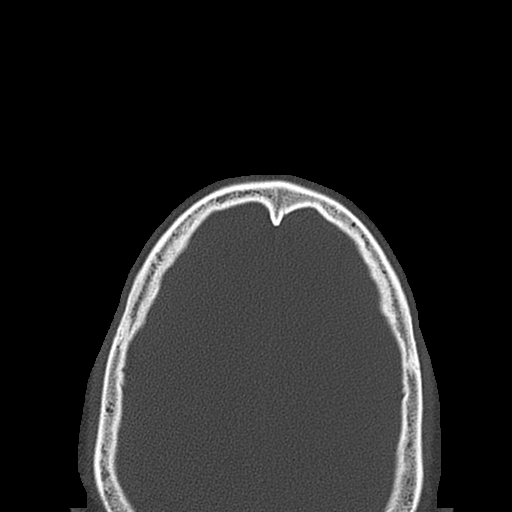

[Series 4: orbits 2.0 coronal · coronal · 0.28mm/px · 3 of 86 slices shown]
[im 22/86  bone]
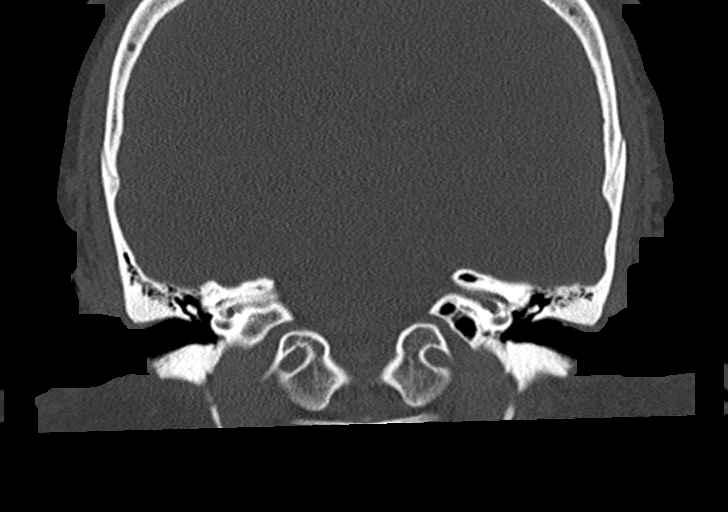
[im 43/86  bone]
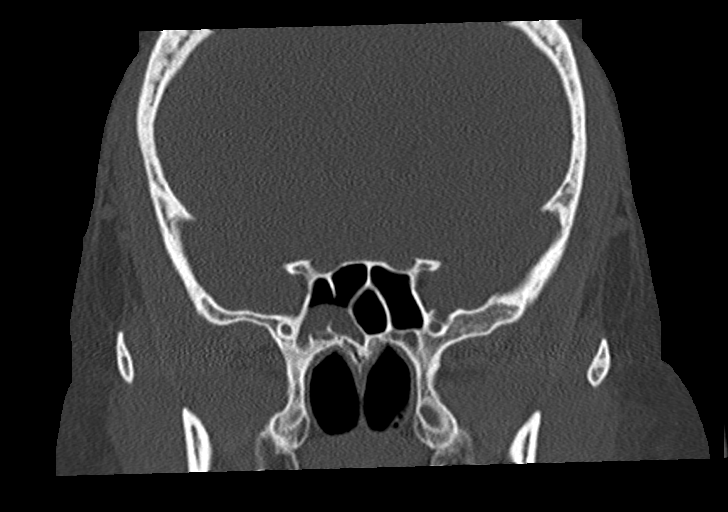
[im 64/86  bone]
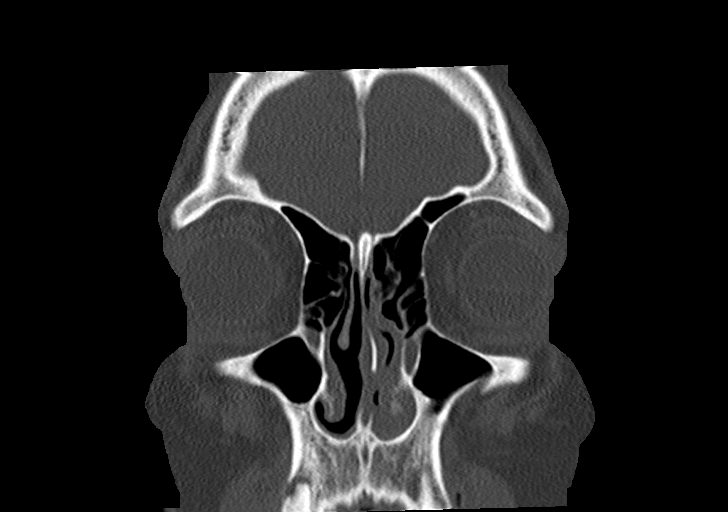

[Series 9: orbits 2.0 sagittal · sagittal · 0.32mm/px · 2 of 96 slices shown]
[im 32/96  bone]
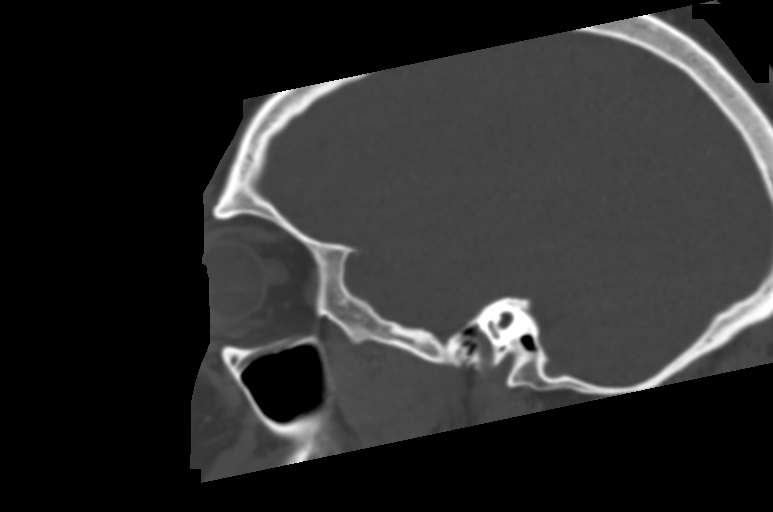
[im 64/96  bone]
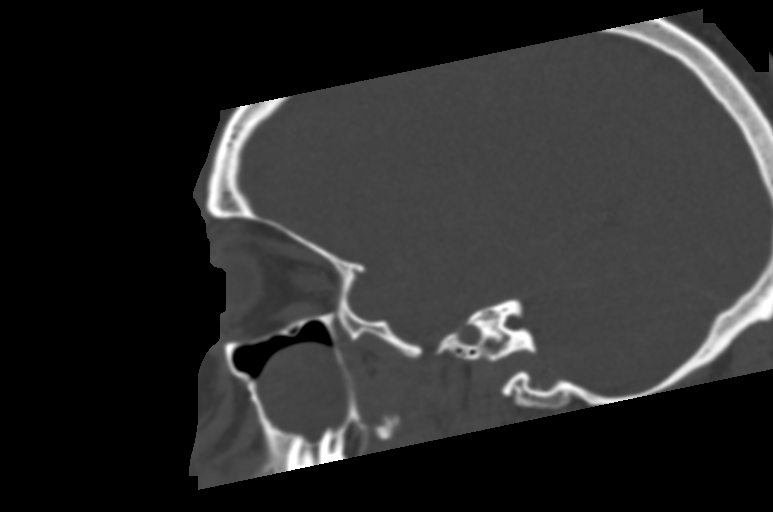

[14 of 47 positions shown; findings below may reference images not displayed]

RADIATION DOSE REDUCTION: This exam was performed according to the
departmental dose-optimization program which includes automated
exposure control, adjustment of the mA and/or kV according to
patient size and/or use of iterative reconstruction technique.

CONTRAST:  75mL OMNIPAQUE IOHEXOL 300 MG/ML  SOLN
FINDINGS: Orbits: No orbital mass or evidence of inflammation. Normal
appearance of the globes, optic nerve-sheath complexes, extraocular
muscles, orbital fat and lacrimal glands. No visible foreign body.

Visible paranasal sinuses: Right maxillary and right sphenoid sinus
retention cysts. Otherwise, sinuses are clear.

Soft tissues: Normal.

Osseous: No fracture or aggressive lesion.

Limited intracranial: No acute or significant finding.
IMPRESSION: No evidence of acute abnormality in the orbits.

## 2021-04-13 MED ORDER — IOHEXOL 300 MG/ML  SOLN
75.0000 mL | Freq: Once | INTRAMUSCULAR | Status: AC | PRN
  Administered 2021-04-13: 75 mL via INTRAVENOUS
  Filled 2021-04-13: qty 75

## 2021-04-13 MED ORDER — ERYTHROMYCIN 5 MG/GM OP OINT: 1.0000 "application " | TOPICAL_OINTMENT | Freq: Four times a day (QID) | OPHTHALMIC | 0 refills | Status: AC

## 2021-04-13 NOTE — Progress Notes (Signed)
[place] ? ?Needs  ED f/u due to pain moving eye now. Concern for cellulitis development, as not much improvement over the last 3 days.  ? ?Patient acknowledged agreement and understanding of the plan.  ? ?

## 2021-04-13 NOTE — Discharge Instructions (Signed)
Please seek medical attention for any high fevers, chest pain, shortness of breath, change in behavior, persistent vomiting, bloody stool or any other new or concerning symptoms.  

## 2021-04-13 NOTE — ED Triage Notes (Signed)
Pt via POV from home. Pt was seen at [facility] on 3/20, states that he was at work and states that a dust machine exploded and does know if something went into his eye. States that the swelling has gone down but the redness has note. Pt is A&Ox4 and NAd ?

## 2021-04-13 NOTE — ED Provider Notes (Signed)
? ?[facility] ?Provider Note ? ? ? Event Date/Time  ? First MD Initiated Contact with Patient 04/13/21 1100   ?  (approximate) ? ? ?History  ? ?Eye Problem ? ? ?HPI ? ?[facility]ryl [facility] is a 31 y.o. male  who, per urgent care note dated 3 days ago was seen for left eye redness and swelling who had been arranged to see ophthalmology later that day, who presents to the emergency department today because of concern for possible post septal cellulitis. Patient had injury to his eye roughly 1 week ago at work. Went to UC and then ophthalmology 3 days ago. At that time ophthalmologist did not think infection was present. Today however the patient says he has discomfort when he moves his eye to the left. Additionally he has noticed some redness to his right eye although denies any discomfort there. Had a follow up telehealth visit today and when he described pain with movement they advised ED visit to evaluate for orbital cellulitis. He denies any fevers.  ? ?Physical Exam  ? ?Triage Vital Signs: ?ED Triage Vitals  ?Enc Vitals Group  ?   BP 04/13/21 1027 (!) 181/97  ?   Pulse Rate 04/13/21 1027 91  ?   Resp 04/13/21 1027 20  ?   Temp 04/13/21 1027 98.5 ?F (36.9 ?C)  ?   Temp Source 04/13/21 1027 Oral  ?   SpO2 04/13/21 1027 98 %  ?   Weight 04/13/21 1019 (!) 360 lb (163.3 kg)  ?   Height 04/13/21 1019 5\' 10"  (1.778 m)  ?   Head Circumference --   ?   Peak Flow --   ?   Pain Score 04/13/21 1019 6  ?   Pain Loc --   ?   Pain Edu? --   ?   Excl. in [place]? --   ? ? ?Most recent vital signs: ?Vitals:  ? 04/13/21 1027  ?BP: (!) 181/97  ?Pulse: 91  ?Resp: 20  ?Temp: 98.5 ?F (36.9 ?C)  ?SpO2: 98%  ? ?General: Awake, no distress.  ?CV:  Good peripheral perfusion.  ?Resp:  Normal effort.  ?Eyes:  Left eye with subconjunctival hemorrhage. No obvious proptosis or swelling.  ?  Right eye with slight redness of the sclera.  ? ? ?ED Results / Procedures / Treatments  ? ?Labs ?(all labs ordered are listed, but only  abnormal results are displayed) ?Labs Reviewed  ?CBC WITH DIFFERENTIAL/PLATELET - Abnormal; Notable for the following components:  ?    Result Value  ? Monocytes Absolute 1.3 (*)   ? All other components within normal limits  ?BASIC METABOLIC PANEL - Abnormal; Notable for the following components:  ? Glucose, Bld 116 (*)   ? All other components within normal limits  ? ? ? ?EKG ? ?None ? ? ?RADIOLOGY ?I independently interpreted and visualized the ct orbit. My interpretation: No abscess ?Radiology interpretation: IMPRESSION: ?No evidence of acute abnormality in the orbits. ? ? ?PROCEDURES: ? ?Critical Care performed: No ? ?Procedures ? ? ?MEDICATIONS ORDERED IN ED: ?Medications - No data to display ? ? ?IMPRESSION / MDM / ASSESSMENT AND PLAN / ED COURSE  ?I reviewed the triage vital signs and the nursing notes. ?             ?               ? ?Differential diagnosis includes, but is not limited to, orbital cellulitis, conjunctivitis. ? ?Patient presented  to the emergency department today at the request of telehealth because of concern for post septal cellulitis. CT orbits without findings concerning for post septal cellulitis. On exam patient also has injection of right conjunctiva. I do have concern for possible conjunctivitis. Discussed this with the patient. Will plan on discharging with erythromycin ointment, and encouraged patient to follow up with ophthalmology. ? ? ?FINAL CLINICAL IMPRESSION(S) / ED DIAGNOSES  ? ?Final diagnoses:  ?Conjunctivitis of both eyes, unspecified conjunctivitis type  ? ? ? ?Rx / DC Orders  ? ?ED Discharge Orders   ? ?      Ordered  ?  erythromycin ophthalmic ointment  4 times daily       ? 04/13/21 1333  ? ?  ?  ? ?  ? ? ? ?Note:  This document was prepared using Dragon voice recognition software and may include unintentional dictation errors. ? ?  ?Nance Pear, MD ?04/13/21 1336 ? ?

## 2021-04-13 NOTE — Patient Instructions (Signed)
Please go to the nearest ED

## 2022-03-05 ENCOUNTER — Other Ambulatory Visit: Payer: Self-pay

## 2022-03-05 ENCOUNTER — Emergency Department
Admission: EM | Admit: 2022-03-05 | Discharge: 2022-03-05 | Disposition: A | Discharge status: Home / Self Care | Payer: 59 | Attending: Emergency Medicine | Admitting: Emergency Medicine

## 2022-03-05 ENCOUNTER — Emergency Department: Payer: 59

## 2022-03-05 DIAGNOSIS — J069 Acute upper respiratory infection, unspecified: Secondary | ICD-10-CM

## 2022-03-05 DIAGNOSIS — Z1152 Encounter for screening for COVID-19: Secondary | ICD-10-CM | POA: Insufficient documentation

## 2022-03-05 DIAGNOSIS — J45909 Unspecified asthma, uncomplicated: Secondary | ICD-10-CM | POA: Diagnosis not present

## 2022-03-05 DIAGNOSIS — R000 Tachycardia, unspecified: Secondary | ICD-10-CM | POA: Insufficient documentation

## 2022-03-05 DIAGNOSIS — R0682 Tachypnea, not elsewhere classified: Secondary | ICD-10-CM | POA: Insufficient documentation

## 2022-03-05 DIAGNOSIS — R051 Acute cough: Secondary | ICD-10-CM

## 2022-03-05 DIAGNOSIS — M791 Myalgia, unspecified site: Secondary | ICD-10-CM

## 2022-03-05 DIAGNOSIS — R059 Cough, unspecified: Secondary | ICD-10-CM | POA: Diagnosis present

## 2022-03-05 DIAGNOSIS — R519 Headache, unspecified: Secondary | ICD-10-CM

## 2022-03-05 LAB — BASIC METABOLIC PANEL
Anion gap: 10 (ref 5–15)
BUN: 13 mg/dL (ref 6–20)
CO2: 24 mmol/L (ref 22–32)
Calcium: 8.8 mg/dL — ABNORMAL LOW (ref 8.9–10.3)
Chloride: 101 mmol/L (ref 98–111)
Creatinine, Ser: 1.05 mg/dL (ref 0.61–1.24)
GFR, Estimated: >60 mL/min (ref >60)
Glucose, Bld: 123 mg/dL — ABNORMAL HIGH (ref 70–99)
Potassium: 3.6 mmol/L (ref 3.5–5.1)
Sodium: 135 mmol/L (ref 135–145)

## 2022-03-05 LAB — CBC
HCT: 46.6 % (ref 39.0–52.0)
Hemoglobin: 15.3 g/dL (ref 13.0–17.0)
MCH: 28.7 pg (ref 26.0–34.0)
MCHC: 32.8 g/dL (ref 30.0–36.0)
MCV: 87.3 fL (ref 80.0–100.0)
Platelets: 335 10*3/uL (ref 150–400)
RBC: 5.34 MIL/uL (ref 4.22–5.81)
RDW: 13.4 % (ref 11.5–15.5)
WBC: 14.9 10*3/uL — ABNORMAL HIGH (ref 4.0–10.5)
nRBC: 0 % (ref 0.0–0.2)

## 2022-03-05 LAB — RESP PANEL BY RT-PCR (RSV, FLU A&B, COVID)  RVPGX2
Influenza A by PCR: NEGATIVE
Influenza B by PCR: NEGATIVE
Resp Syncytial Virus by PCR: NEGATIVE
SARS Coronavirus 2 by RT PCR: NEGATIVE

## 2022-03-05 LAB — TROPONIN I (HIGH SENSITIVITY)
Troponin I (High Sensitivity): 8 ng/L (ref <18)
Troponin I (High Sensitivity): 6 ng/L (ref <18)

## 2022-03-05 MED ORDER — SODIUM CHLORIDE 0.9 % IV BOLUS
2000.0000 mL | Freq: Once | INTRAVENOUS | Status: AC
  Administered 2022-03-05: 2000 mL via INTRAVENOUS

## 2022-03-05 MED ORDER — ONDANSETRON HCL 4 MG/2ML IJ SOLN
4.0000 mg | Freq: Once | INTRAMUSCULAR | Status: AC
  Administered 2022-03-05: 4 mg via INTRAVENOUS
  Filled 2022-03-05: qty 2

## 2022-03-05 MED ORDER — ALBUTEROL SULFATE (2.5 MG/3ML) 0.083% IN NEBU
3.0000 mL | INHALATION_SOLUTION | Freq: Once | RESPIRATORY_TRACT | Status: AC
  Administered 2022-03-05: 3 mL via RESPIRATORY_TRACT
  Filled 2022-03-05: qty 3

## 2022-03-05 MED ORDER — ACETAMINOPHEN 500 MG PO TABS
1000.0000 mg | ORAL_TABLET | Freq: Once | ORAL | Status: AC
  Administered 2022-03-05: 1000 mg via ORAL
  Filled 2022-03-05: qty 2

## 2022-03-05 MED ORDER — KETOROLAC TROMETHAMINE 30 MG/ML IJ SOLN
30.0000 mg | Freq: Once | INTRAMUSCULAR | Status: AC
  Administered 2022-03-05: 30 mg via INTRAVENOUS
  Filled 2022-03-05: qty 1

## 2022-03-05 NOTE — Discharge Instructions (Addendum)
Take acetaminophen 650 mg and ibuprofen 400 mg every 6 hours for aches and pain.  Take with food.  Drink plenty of fluids to stay well-hydrated.   I made a referral to sleep studies to check on your snoring and assess for sleep apnea.  Thank you for choosing [place] for your health care today!  Please see your primary doctor this week for a follow up appointment.   Sometimes, in the early stages of certain disease courses it is difficult to detect in the emergency department evaluation -- so, it is important that you continue to monitor your symptoms and call your doctor right away or return to the emergency department if you develop any new or worsening symptoms.  Please go to the following website to schedule new (and existing) patient appointments:   [email or web address]  If you do not have a primary doctor try calling the following clinics to establish care:  If you have insurance:  [facility] [phone] [place] [address]   [facility]  [phone] [place] [address]   If you do not have insurance:  [facility]  [phone] [address], [place] [address]   The following is another list of primary care offices in the area who are accepting new patients at this time.  Please reach out to one of them directly and let them know you would like to schedule an appointment to follow up on an Emergency Department visit, and/or to establish a new primary care provider (PCP).  There are likely other primary care clinics in the are who are accepting new patients, but this is an excellent place to start:  [place] physician: Dr Lavon Paganini [address] [place], [place] [address] [phone]  [facility] Lead Physician: Dr Steele Sizer [address], [place], [place] [address] [phone]  [facility] Physician: Dr Park Liter [address] [place], [place] [address] [phone]  [facility] Lead Physician: Dr Dewaine Oats [place], [place], [place] [address] [phone]  [facility] Physician: Dr Halina Maidens [address] [place], [place] [address] [phone]   It was my pleasure to care for you today.   Hoover Brunette Jacelyn Grip, MD

## 2022-03-05 NOTE — ED Triage Notes (Signed)
Patient arrived to ED via POV reporting headache and nasal congestion x 4 days. States he woke with chest pain and shortness of breath at 0230 this morning. AOX4, labored respirations with accessory muscle use.

## 2022-03-05 NOTE — ED Notes (Signed)
Patient transported to X-ray 

## 2022-03-05 NOTE — ED Provider Notes (Signed)
[facility] Provider Note    Event Date/Time   First MD Initiated Contact with Patient 03/05/22 0701     (approximate)   History   Headache, Chest Pain, and Shortness of Breath   HPI  Darryl Gordon is a 32 y.o. male   Past medical history of asthma and elevated BMI who presents to the emergency department with 1 week of headache, myalgia, mild cough, nasal congestion, sinus congestion, shortness of breath and chest pain.  He has no known sick contacts.  He has not been using his inhaler treatment because he does not have any.  He does not recall fever.  He does have chills.  He has no GI or GU symptoms.    Independent Historian contributed to assessment above: His girlfriend who is at bedside  External Medical Documents Reviewed: Emergency department visit dated October 2022 with respiratory infectious symptoms diagnosed with URI and asthma exacerbation      Physical Exam   Triage Vital Signs: ED Triage Vitals  Enc Vitals Group     BP 03/05/22 0643 (!) 163/99     Pulse Rate 03/05/22 0643 (!) 112     Resp 03/05/22 0643 (!) 24     Temp 03/05/22 0643 98.8 F (37.1 C)     Temp Source 03/05/22 0643 Oral     SpO2 03/05/22 0643 95 %     Weight 03/05/22 0641 (!) 385 lb (174.6 kg)     Height 03/05/22 0641 5' 10"$  (1.778 m)     Head Circumference --      Peak Flow --      Pain Score 03/05/22 0641 10     Pain Loc --      Pain Edu? --      Excl. in [place]? --     Most recent vital signs: Vitals:   03/05/22 0700 03/05/22 0730  BP: (!) 162/95   Pulse: (!) 117 (!) 107  Resp: 20 20  Temp:    SpO2: 98% 96%    General: Awake, no distress.  CV:  Good peripheral perfusion.  Resp:  Normal effort.  Abd:  No distention.  Other:  Initial vital signs show tachycardia 112, tachypnea at 24, no hypoxemia and he does hypertensive at 160/99.  When I assessed him at bedside his heart rate is in the 90s and respiratory rate 16 with normal respirations, no  respiratory distress, no obvious wheezing or focalities or rales, no peripheral edema, radial pulses intact bilaterally, comfortable speaking in full sentences.  Abdomen is soft and nontender.   ED Results / Procedures / Treatments   Labs (all labs ordered are listed, but only abnormal results are displayed) Labs Reviewed  BASIC METABOLIC PANEL - Abnormal; Notable for the following components:      Result Value   Glucose, Bld 123 (*)    Calcium 8.8 (*)    All other components within normal limits  CBC - Abnormal; Notable for the following components:   WBC 14.9 (*)    All other components within normal limits  RESP PANEL BY RT-PCR (RSV, FLU A&B, COVID)  RVPGX2  TROPONIN I (HIGH SENSITIVITY)  TROPONIN I (HIGH SENSITIVITY)     I ordered and reviewed the above labs they are notable for he has a white blood cell count of 14.9  EKG  ED ECG REPORT I, Lucillie Garfinkel, the attending physician, personally viewed and interpreted this ECG.   Date: 03/05/2022  EKG Time: 0641  Rate:  115  Rhythm: sinus tachycardia   Axis: nl  Intervals:none  ST&T Change: no acute ischemic changes    RADIOLOGY I independently reviewed and interpreted cxr and see no obvious focal opacity or pneumothorax   PROCEDURES:  Critical Care performed: No  Procedures   MEDICATIONS ORDERED IN ED: Medications  sodium chloride 0.9 % bolus 2,000 mL (0 mLs Intravenous Stopped 03/05/22 0931)  ketorolac (TORADOL) 30 MG/ML injection 30 mg (30 mg Intravenous Given 03/05/22 0735)  acetaminophen (TYLENOL) tablet 1,000 mg (1,000 mg Oral Given 03/05/22 0734)  ondansetron (ZOFRAN) injection 4 mg (4 mg Intravenous Given 03/05/22 0735)  albuterol (PROVENTIL) (2.5 MG/3ML) 0.083% nebulizer solution 3 mL (3 mLs Inhalation Given 03/05/22 0734)     IMPRESSION / MDM / ASSESSMENT AND PLAN / ED COURSE  I reviewed the triage vital signs and the nursing notes.                                Patient's presentation is most  consistent with acute presentation with potential threat to life or bodily function.  Differential diagnosis includes, but is not limited to, viral uri, bacterial pneumonia, sepsis, asthma exacerbation, acs, pe, dissection, considered but less likely cva, intracranial bleeding, meningitis   The patient is on the cardiac monitor to evaluate for evidence of arrhythmia and/or significant heart rate changes.  MDM:  Symptoms consistent w/ viral uri no obvious wheeze or evidence of asthma exacerbation. Less likely acs/cva/ich given atypical sx and constellation of sx more consistent w viral illness.   Workup unremarkable, serial troponins flat, EKG is nonischemic and patient is comfortable sleeping in the emergency department now.  He is snoring.  His girlfriend states that he snores a lot during when he sleeps so I referred out to sleep study to assess for OSA.  I considered hospitalization for admission or observation but since the patient is stable with negative workup as above and symptoms most consistent with viral illness, plan is for outpatient treatment and follow-up, as well as sleep study referral made.  Return precautions were given.        FINAL CLINICAL IMPRESSION(S) / ED DIAGNOSES   Final diagnoses:  Acute cough  Myalgia  Nonintractable headache, unspecified chronicity pattern, unspecified headache type  Viral URI with cough     Rx / DC Orders   ED Discharge Orders          Ordered    Ambulatory referral to Sleep Studies        03/05/22 0932             Note:  This document was prepared using Dragon voice recognition software and may include unintentional dictation errors.    Lucillie Garfinkel, MD 03/05/22 [id number]

## 2023-01-18 ENCOUNTER — Other Ambulatory Visit: Payer: Self-pay

## 2023-01-18 DIAGNOSIS — K0889 Other specified disorders of teeth and supporting structures: Secondary | ICD-10-CM | POA: Diagnosis present

## 2023-01-18 DIAGNOSIS — J45909 Unspecified asthma, uncomplicated: Secondary | ICD-10-CM | POA: Diagnosis not present

## 2023-01-18 NOTE — ED Triage Notes (Signed)
Pt presents to ER with c/o dental pain and possible facial swelling that started 12/25.  Pt reports the swelling feels like it started in his front-upper jaw, and has since been moving up his face, below his left eye.  No discernable swelling noted in triage.  Pt states he has chipped his front teeth appx one month ago.  Pt is otherwise A&O x4 and in NAD at this time.

## 2023-01-19 ENCOUNTER — Emergency Department
Admission: EM | Admit: 2023-01-19 | Discharge: 2023-01-19 | Disposition: A | Discharge status: Home / Self Care | Payer: 59 | Attending: Emergency Medicine | Admitting: Emergency Medicine

## 2023-01-19 DIAGNOSIS — K0889 Other specified disorders of teeth and supporting structures: Secondary | ICD-10-CM

## 2023-01-19 MED ORDER — AMOXICILLIN-POT CLAVULANATE 875-125 MG PO TABS: 1.0000 | ORAL_TABLET | Freq: Two times a day (BID) | ORAL | 0 refills | Status: AC

## 2023-01-19 MED ORDER — AMOXICILLIN-POT CLAVULANATE 875-125 MG PO TABS
1.0000 | ORAL_TABLET | Freq: Once | ORAL | Status: AC
  Administered 2023-01-19: 1 via ORAL
  Filled 2023-01-19: qty 1

## 2023-01-19 MED ORDER — IBUPROFEN 600 MG PO TABS: 600.0000 mg | ORAL_TABLET | Freq: Four times a day (QID) | ORAL | 0 refills | Status: AC | PRN

## 2023-01-19 MED ORDER — IBUPROFEN 600 MG PO TABS
600.0000 mg | ORAL_TABLET | Freq: Once | ORAL | Status: AC
  Administered 2023-01-19: 600 mg via ORAL
  Filled 2023-01-19: qty 1

## 2023-01-19 NOTE — Discharge Instructions (Signed)
Take the antibiotic as prescribed and finish the full course.  You may take ibuprofen if needed for pain.  We have given you information for local dental clinics for follow-up.  Return to the ER for new, worsening, or persistent severe swelling, pain, fever, or any other new or worsening symptoms that concern you.

## 2023-01-19 NOTE — ED Provider Notes (Signed)
[facility] Provider Note    Event Date/Time   First MD Initiated Contact with Patient 01/19/23 0113     (approximate)   History   Dental Pain   HPI  Darryl Gordon is a 32 y.o. male with a history of asthma and obesity who presents with dental pain and facial swelling for the last 3 days.  The patient states initially he had a toothache to one of his frontal teeth for the last 3 days.  Over the last day the pain has gone up towards the area around his nose.  He has sent swelling in this area.  He denies any pain in the back of his mouth or throat or any difficulty swallowing.  He has no fever.  He denies any headache.  He has extensive decay to both front upper teeth.    Physical Exam   Triage Vital Signs: ED Triage Vitals  Encounter Vitals Group     BP 01/18/23 2119 (!) 175/95     Systolic BP Percentile --      Diastolic BP Percentile --      Pulse Rate 01/18/23 2119 91     Resp 01/18/23 2119 18     Temp 01/18/23 2119 97.9 F (36.6 C)     Temp Source 01/18/23 2119 Oral     SpO2 01/18/23 2119 95 %     Weight 01/18/23 2120 (!) 343 lb 14.7 oz (156 kg)     Height 01/18/23 2120 5\' 9"  (1.753 m)     Head Circumference --      Peak Flow --      Pain Score 01/18/23 2119 8     Pain Loc --      Pain Education --      Exclude from Growth Chart --     Most recent vital signs: Vitals:   01/18/23 2119 01/19/23 0211  BP: (!) 175/95 (!) 174/90  Pulse: 91 90  Resp: 18 20  Temp: 97.9 F (36.6 C) 98 F (36.7 C)  SpO2: 95% 95%     General: Awake, no distress.  CV:  Good peripheral perfusion.  Resp:  Normal effort.  Abd:  No distention.  Other:  Decayed front upper teeth.  Gums appear slightly erythematous.  No palpable abscess.  No visible facial swelling.  No erythema, induration, abnormal warmth, or fluctuance.  Oropharynx clear.   ED Results / Procedures / Treatments   Labs (all labs ordered are listed, but only abnormal results are  displayed) Labs Reviewed - No data to display   EKG     RADIOLOGY    PROCEDURES:  Critical Care performed: No  Procedures   MEDICATIONS ORDERED IN ED: Medications  amoxicillin-clavulanate (AUGMENTIN) 875-125 MG per tablet 1 tablet (1 tablet Oral Given 01/19/23 0159)  ibuprofen (ADVIL) tablet 600 mg (600 mg Oral Given 01/19/23 0159)     IMPRESSION / MDM / ASSESSMENT AND PLAN / ED COURSE  I reviewed the triage vital signs and the nursing notes.  Differential diagnosis includes, but is not limited to, toothache due to caries, dental infection.  There is no clinical evidence for abscess.  There is no visible swelling or palpable abnormality on my exam.  The patient is stable for discharge home.  I will prescribe a course of antibiotics, analgesia, and I have given him a list of dental clinic resources.  I gave strict return precautions and the patient expressed understanding.    FINAL CLINICAL IMPRESSION(S) /  ED DIAGNOSES   Final diagnoses:  Pain, dental     Rx / DC Orders   ED Discharge Orders          Ordered    amoxicillin-clavulanate (AUGMENTIN) 875-125 MG tablet  2 times daily        01/19/23 0154    ibuprofen (ADVIL) 600 MG tablet  Every 6 hours PRN        01/19/23 0154             Note:  This document was prepared using Dragon voice recognition software and may include unintentional dictation errors.    Dionne Bucy, MD 01/19/23 [id number]

## 2023-02-26 ENCOUNTER — Ambulatory Visit
Admission: EM | Admit: 2023-02-26 | Discharge: 2023-02-26 | Disposition: A | Discharge status: Home / Self Care | Payer: BLUE CROSS/BLUE SHIELD | Attending: Emergency Medicine | Admitting: Emergency Medicine

## 2023-02-26 DIAGNOSIS — J09X2 Influenza due to identified novel influenza A virus with other respiratory manifestations: Secondary | ICD-10-CM | POA: Insufficient documentation

## 2023-02-26 LAB — RESP PANEL BY RT-PCR (FLU A&B, COVID) ARPGX2
Influenza A by PCR: POSITIVE — AB
Influenza B by PCR: NEGATIVE
SARS Coronavirus 2 by RT PCR: NEGATIVE

## 2023-02-26 LAB — GROUP A STREP BY PCR: Group A Strep by PCR: NOT DETECTED

## 2023-02-26 MED ORDER — IPRATROPIUM BROMIDE 0.06 % NA SOLN: 2.0000 | Freq: Four times a day (QID) | NASAL | 12 refills | Status: AC

## 2023-02-26 MED ORDER — ALBUTEROL SULFATE HFA 108 (90 BASE) MCG/ACT IN AERS: 2.0000 | INHALATION_SPRAY | RESPIRATORY_TRACT | 1 refills | Status: AC | PRN

## 2023-02-26 MED ORDER — OSELTAMIVIR PHOSPHATE 75 MG PO CAPS: 75.0000 mg | ORAL_CAPSULE | Freq: Two times a day (BID) | ORAL | 0 refills | Status: AC

## 2023-02-26 MED ORDER — PROMETHAZINE-DM 6.25-15 MG/5ML PO SYRP: 5.0000 mL | ORAL_SOLUTION | Freq: Four times a day (QID) | ORAL | 0 refills | Status: AC | PRN

## 2023-02-26 MED ORDER — BENZONATATE 100 MG PO CAPS: 200.0000 mg | ORAL_CAPSULE | Freq: Three times a day (TID) | ORAL | 0 refills | Status: AC

## 2023-02-26 NOTE — Discharge Instructions (Signed)
 Take the Tamiflu  twice daily for 5 days for treatment of influenza.  Use your albuterol  inhaler with the spacer, 1 to 2 puffs every 4-6 hours, as needed for any shortness of breath or wheezing.  Use over-the-counter Tylenol  and/or ibuprofen  according the package instructions as needed for any fever or pain.  Use the Atrovent  nasal spray, 2 squirts up each nostril every 6 hours, as needed for nasal congestion and runny nose.  Use over-the-counter Delsym, Zarbee's, or Robitussin during the day as needed for cough.  Use the Tessalon  Perles every 8 hours as needed for cough.  Taken with a small sip of water.  You may experience some numbness to your tongue or metallic taste in your mouth, this is normal.  Use the Promethazine  DM cough syrup at bedtime as will make you drowsy but it should help dry up your postnasal drip and aid you in sleep and cough relief.  Return for reevaluation, or see your primary care provider, for new or worsening symptoms.

## 2023-02-26 NOTE — ED Provider Notes (Signed)
 [facility]    CSN: [id number] Arrival date & time: 02/26/23  0910      History   Chief Complaint Chief Complaint  Patient presents with   Cough   Fever   Sore Throat   Shortness of Breath   Back Pain    HPI Darryl Gordon is a 33 y.o. male.   HPI  33 year old male with past medical history significant for asthma and morbid obesity presents for evaluation of flulike symptoms that began last night.  He reports when he was at work he checked his temp and it registered at 101.  This is associated with runny nose, nasal congestion, sore throat, nonproductive cough, headache, body aches, and wheezing.  He states he has not used his inhaler as he needs a refill.  He denies any ear pain.  Both of his children are flu positive.  Past Medical History:  Diagnosis Date   Asthma    Morbid obesity (HCC)     There are no active problems to display for this patient.   Past Surgical History:  Procedure Laterality Date   TONSILLECTOMY         Home Medications    Prior to Admission medications   Medication Sig Start Date End Date Taking? Authorizing Provider  benzonatate  (TESSALON ) 100 MG capsule Take 2 capsules (200 mg total) by mouth every 8 (eight) hours. 02/26/23  Yes Bernardino Ditch, NP  ipratropium (ATROVENT ) 0.06 % nasal spray Place 2 sprays into both nostrils 4 (four) times daily. 02/26/23  Yes Bernardino Ditch, NP  oseltamivir  (TAMIFLU ) 75 MG capsule Take 1 capsule (75 mg total) by mouth every 12 (twelve) hours. 02/26/23  Yes Bernardino Ditch, NP  promethazine -dextromethorphan (PROMETHAZINE -DM) 6.25-15 MG/5ML syrup Take 5 mLs by mouth 4 (four) times daily as needed. 02/26/23  Yes Bernardino Ditch, NP  albuterol  (VENTOLIN  HFA) 108 (90 Base) MCG/ACT inhaler Inhale 2 puffs into the lungs every 4 (four) hours as needed. 02/26/23   Bernardino Ditch, NP  erythromycin  ophthalmic ointment Place 1 application. into both eyes 4 (four) times daily. 04/13/21   Goodman, Graydon, MD  ibuprofen   (ADVIL ) 600 MG tablet Take 1 tablet (600 mg total) by mouth every 6 (six) hours as needed. 01/19/23   Jacolyn Pae, MD  Spacer/Aero-Holding Chambers (AEROCHAMBER MV) inhaler Use as instructed 11/02/20   Bernardino Ditch, NP    Family History Family History  Problem Relation Age of Onset   Hypertension Mother    Hypertension Father     Social History Social History   Tobacco Use   Smoking status: Some Days    Types: Cigars   Smokeless tobacco: Never  Substance Use Topics   Alcohol use: Yes    Comment: social   Drug use: No     Allergies   Patient has no known allergies.   Review of Systems Review of Systems  Constitutional:  Positive for fever.  HENT:  Positive for congestion, rhinorrhea and sore throat. Negative for ear pain.   Respiratory:  Positive for cough, shortness of breath and wheezing.   Gastrointestinal:  Negative for diarrhea, nausea and vomiting.  Musculoskeletal:  Positive for arthralgias and myalgias.  Neurological:  Positive for headaches.     Physical Exam Triage Vital Signs ED Triage Vitals  Encounter Vitals Group     BP 02/26/23 0953 (!) 162/99     Systolic BP Percentile --      Diastolic BP Percentile --      Pulse Rate 02/26/23 0953  94     Resp 02/26/23 0953 20     Temp 02/26/23 0953 98.4 F (36.9 C)     Temp Source 02/26/23 0953 Oral     SpO2 02/26/23 0953 95 %     Weight --      Height --      Head Circumference --      Peak Flow --      Pain Score 02/26/23 0952 7     Pain Loc --      Pain Education --      Exclude from Growth Chart --    No data found.  Updated Vital Signs BP (!) 162/99 (BP Location: Left Arm)   Pulse 94   Temp 98.4 F (36.9 C) (Oral)   Resp 20   SpO2 95%   Visual Acuity Right Eye Distance:   Left Eye Distance:   Bilateral Distance:    Right Eye Near:   Left Eye Near:    Bilateral Near:     Physical Exam Vitals and nursing note reviewed.  Constitutional:      Appearance: Normal appearance.  He is not ill-appearing.  HENT:     Head: Normocephalic and atraumatic.     Right Ear: Tympanic membrane, ear canal and external ear normal. There is no impacted cerumen.     Left Ear: Tympanic membrane, ear canal and external ear normal. There is no impacted cerumen.     Nose: Congestion and rhinorrhea present.     Comments: Nasal mucosa is erythematous and edematous with clear discharge in both nares.    Mouth/Throat:     Mouth: Mucous membranes are moist.     Pharynx: Oropharynx is clear. Posterior oropharyngeal erythema present. No oropharyngeal exudate.     Comments: Erythema and edema to the oropharynx.  Tonsils are surgically absent. Cardiovascular:     Rate and Rhythm: Normal rate and regular rhythm.     Pulses: Normal pulses.     Heart sounds: Normal heart sounds. No murmur heard.    No friction rub. No gallop.  Pulmonary:     Effort: Pulmonary effort is normal.     Breath sounds: Normal breath sounds. No wheezing, rhonchi or rales.  Musculoskeletal:     Cervical back: Normal range of motion and neck supple. No tenderness.  Lymphadenopathy:     Cervical: No cervical adenopathy.  Skin:    General: Skin is warm and dry.     Capillary Refill: Capillary refill takes less than 2 seconds.     Findings: No rash.  Neurological:     General: No focal deficit present.     Mental Status: He is alert and oriented to person, place, and time.      UC Treatments / Results  Labs (all labs ordered are listed, but only abnormal results are displayed) Labs Reviewed  RESP PANEL BY RT-PCR (FLU A&B, COVID) ARPGX2 - Abnormal; Notable for the following components:      Result Value   Influenza A by PCR POSITIVE (*)    All other components within normal limits  GROUP A STREP BY PCR    EKG   Radiology No results found.  Procedures Procedures (including critical care time)  Medications Ordered in UC Medications - No data to display  Initial Impression / Assessment and Plan / UC  Course  I have reviewed the triage vital signs and the nursing notes.  Pertinent labs & imaging results that were available during my care of the  patient were reviewed by me and considered in my medical decision making (see chart for details).   Patient is a pleasant, nontoxic-appearing 33 year old male presenting for evaluation of flulike symptoms as outlined HPI above.  Given that both of his children are currently flu positive I suspect most likely that the patient does have influenza.  However, I will also order a COVID PCR and strep PCR given the erythema and edema of the posterior oropharynx.  He does endorse shortness of breath and wheezing though he is able to speak in full sentence without dyspnea or tachypnea.  Respiratory rate at triage was 20 with a 95% room air oxygen  saturation.  Patient is currently afebrile with an oral temp of 98.4.  Patient's lung sounds are clear to auscultation in all fields.  Strep PCR is negative.  Respiratory panel is positive for influenza A.  I will discharge patient home with a diagnosis of influenza A and start him on Tamiflu  75 mg twice daily for 5 days.  He may use over-the-counter Tylenol  and ibuprofen  as needed for fever or pain.  Atrovent  nasal spray for his nasal congestion along with Tessalon  Perles and Promethazine  DM cough syrup for cough and congestion.  Return precautions reviewed.  Work note provided.  I will also refill the patient's albuterol  inhaler.   Final Clinical Impressions(s) / UC Diagnoses   Final diagnoses:  Influenza due to identified novel influenza A virus with other respiratory manifestations     Discharge Instructions      Take the Tamiflu  twice daily for 5 days for treatment of influenza.  Use your albuterol  inhaler with the spacer, 1 to 2 puffs every 4-6 hours, as needed for any shortness of breath or wheezing.  Use over-the-counter Tylenol  and/or ibuprofen  according the package instructions as needed for any fever  or pain.  Use the Atrovent  nasal spray, 2 squirts up each nostril every 6 hours, as needed for nasal congestion and runny nose.  Use over-the-counter Delsym, Zarbee's, or Robitussin during the day as needed for cough.  Use the Tessalon  Perles every 8 hours as needed for cough.  Taken with a small sip of water.  You may experience some numbness to your tongue or metallic taste in your mouth, this is normal.  Use the Promethazine  DM cough syrup at bedtime as will make you drowsy but it should help dry up your postnasal drip and aid you in sleep and cough relief.  Return for reevaluation, or see your primary care provider, for new or worsening symptoms.      ED Prescriptions     Medication Sig Dispense Auth. Provider   albuterol  (VENTOLIN  HFA) 108 (90 Base) MCG/ACT inhaler Inhale 2 puffs into the lungs every 4 (four) hours as needed. 18 g Bernardino Ditch, NP   benzonatate  (TESSALON ) 100 MG capsule Take 2 capsules (200 mg total) by mouth every 8 (eight) hours. 21 capsule Bernardino Ditch, NP   ipratropium (ATROVENT ) 0.06 % nasal spray Place 2 sprays into both nostrils 4 (four) times daily. 15 mL Bernardino Ditch, NP   promethazine -dextromethorphan (PROMETHAZINE -DM) 6.25-15 MG/5ML syrup Take 5 mLs by mouth 4 (four) times daily as needed. 118 mL Bernardino Ditch, NP   oseltamivir  (TAMIFLU ) 75 MG capsule Take 1 capsule (75 mg total) by mouth every 12 (twelve) hours. 10 capsule Bernardino Ditch, NP      PDMP not reviewed this encounter.   Bernardino Ditch, NP 02/26/23 1113

## 2023-02-26 NOTE — ED Triage Notes (Signed)
Sx x 1 days  Headache Body aches Sore throat Sob  fever

## 2023-10-17 ENCOUNTER — Ambulatory Visit: Admitting: Family Medicine

## 2023-11-01 ENCOUNTER — Ambulatory Visit: Admitting: Family Medicine
